# Patient Record
Sex: Female | Born: 1950 | Race: Asian | Hispanic: No | Marital: Single | Smoking: Never smoker
Health system: Southern US, Community
[De-identification: ages and names within clinical notes are randomized; demographics above are authoritative.]

## PROBLEM LIST (undated history)

## (undated) DIAGNOSIS — E119 Type 2 diabetes mellitus without complications: Secondary | ICD-10-CM

## (undated) DIAGNOSIS — E079 Disorder of thyroid, unspecified: Secondary | ICD-10-CM

## (undated) HISTORY — DX: Type 2 diabetes mellitus without complications: E11.9

## (undated) HISTORY — DX: Disorder of thyroid, unspecified: E07.9

---

## 2010-09-28 ENCOUNTER — Emergency Department: Payer: Self-pay | Admitting: Emergency Medicine

## 2012-12-27 ENCOUNTER — Emergency Department: Payer: Self-pay | Admitting: Emergency Medicine

## 2012-12-27 LAB — COMPREHENSIVE METABOLIC PANEL
Alkaline Phosphatase: 102 U/L (ref 50–136)
Anion Gap: 5 — ABNORMAL LOW (ref 7–16)
Bilirubin,Total: 0.9 mg/dL (ref 0.2–1.0)
Calcium, Total: 9.8 mg/dL (ref 8.5–10.1)
Co2: 28 mmol/L (ref 21–32)
EGFR (African American): >60
EGFR (Non-African Amer.): >60
Glucose: 140 mg/dL — ABNORMAL HIGH (ref 65–99)
SGOT(AST): 31 U/L (ref 15–37)
Sodium: 135 mmol/L — ABNORMAL LOW (ref 136–145)

## 2012-12-27 LAB — URINALYSIS, COMPLETE
Glucose,UR: NEGATIVE mg/dL (ref 0–75)
Protein: NEGATIVE
Squamous Epithelial: <1
WBC UR: 5 /HPF (ref 0–5)

## 2012-12-27 LAB — LIPASE, BLOOD: Lipase: 166 U/L (ref 73–393)

## 2012-12-27 LAB — CBC
HCT: 39.8 % (ref 35.0–47.0)
MCH: 29.5 pg (ref 26.0–34.0)
Platelet: 278 10*3/uL (ref 150–440)
RBC: 4.48 10*6/uL (ref 3.80–5.20)
RDW: 13.2 % (ref 11.5–14.5)

## 2013-10-14 DIAGNOSIS — I10 Essential (primary) hypertension: Secondary | ICD-10-CM | POA: Insufficient documentation

## 2014-05-13 ENCOUNTER — Institutional Professional Consult (permissible substitution): Payer: Self-pay | Admitting: Internal Medicine

## 2014-05-17 ENCOUNTER — Ambulatory Visit (INDEPENDENT_AMBULATORY_CARE_PROVIDER_SITE_OTHER): Payer: BC Managed Care – PPO | Admitting: Podiatry

## 2014-05-17 ENCOUNTER — Encounter: Payer: Self-pay | Admitting: Podiatry

## 2014-05-17 ENCOUNTER — Ambulatory Visit (INDEPENDENT_AMBULATORY_CARE_PROVIDER_SITE_OTHER): Payer: BC Managed Care – PPO

## 2014-05-17 VITALS — BP 110/65 | HR 79 | Resp 16 | Ht 61.0 in | Wt 135.0 lb

## 2014-05-17 DIAGNOSIS — M19079 Primary osteoarthritis, unspecified ankle and foot: Secondary | ICD-10-CM

## 2014-05-17 DIAGNOSIS — M7750 Other enthesopathy of unspecified foot: Secondary | ICD-10-CM

## 2014-05-17 DIAGNOSIS — M779 Enthesopathy, unspecified: Secondary | ICD-10-CM

## 2014-05-17 DIAGNOSIS — E119 Type 2 diabetes mellitus without complications: Secondary | ICD-10-CM

## 2014-05-17 DIAGNOSIS — M778 Other enthesopathies, not elsewhere classified: Secondary | ICD-10-CM

## 2014-05-17 NOTE — Progress Notes (Signed)
   Subjective:    Patient ID: Kathryn Poole, female    DOB: [birth year], 63 y.o.   MRN: [id number]  HPI Comments: Both of her feet hurt on the top. They have been hurting for 1 - 2 years. They are getting worse. It hurts to walk and stand. She was seen by dr Leeroy Bockchelsea holland and had x-rays and said she has arthritis. She does nothing for her feet.  Foot Pain      Review of Systems  Musculoskeletal:       Difficulty walking  All other systems reviewed and are negative.      Objective:   Physical Exam: I have reviewed her past medical history medications out of the surgery social history and review of systems. Pulses are strongly palpable bilateral. Neurologic sensorium is intact per Semmes-Weinstein monofilament. Deep tendon reflexes are intact bilaterally muscle strength is 5 over 5 dorsiflexion plantar flexors and inverters everters all intrinsic musculature is intact. Orthopedic evaluation demonstrates all joints distal to the ankle have a full range of motion without crepitation. 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 Radiographic evaluation confirms this. Cutaneous evaluation of his wrist supple well-hydrated daily is no erythema or edema cellulitis drainage or odor.        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  Plan: Injected dorsal aspect bilateral foot today with Kenalog and local anesthetic. Follow-up with me as needed.

## 2015-10-20 ENCOUNTER — Other Ambulatory Visit: Payer: Self-pay | Admitting: Nurse Practitioner

## 2015-10-20 DIAGNOSIS — Z1231 Encounter for screening mammogram for malignant neoplasm of breast: Secondary | ICD-10-CM

## 2015-11-15 ENCOUNTER — Ambulatory Visit
Admission: RE | Admit: 2015-11-15 | Discharge: 2015-11-15 | Disposition: A | Discharge status: Home / Self Care | Payer: BLUE CROSS/BLUE SHIELD | Source: Ambulatory Visit | Attending: Nurse Practitioner | Admitting: Nurse Practitioner

## 2015-11-15 DIAGNOSIS — Z1231 Encounter for screening mammogram for malignant neoplasm of breast: Secondary | ICD-10-CM | POA: Insufficient documentation

## 2016-04-19 ENCOUNTER — Encounter: Payer: Self-pay | Admitting: *Deleted

## 2016-04-19 ENCOUNTER — Encounter: Payer: BLUE CROSS/BLUE SHIELD | Attending: Internal Medicine | Admitting: *Deleted

## 2016-04-19 VITALS — BP 110/76 | Ht 59.0 in | Wt 137.7 lb

## 2016-04-19 DIAGNOSIS — Z6827 Body mass index (BMI) 27.0-27.9, adult: Secondary | ICD-10-CM | POA: Insufficient documentation

## 2016-04-19 DIAGNOSIS — Z713 Dietary counseling and surveillance: Secondary | ICD-10-CM | POA: Diagnosis not present

## 2016-04-19 DIAGNOSIS — E119 Type 2 diabetes mellitus without complications: Secondary | ICD-10-CM | POA: Diagnosis not present

## 2016-04-19 NOTE — Patient Instructions (Addendum)
Check blood sugars before breakfast every day and 2 hrs after supper 3-4 x week  Exercise: Continue walking  for   120  minutes   5  days a week  Eat 3 meals day, 1-2 snacks a day Space meals 4-6 hours apart Don't skip meals Complete 3 Day Food Record and bring to next appt  Bring blood sugar records to the next appointment  [facility] for [facility] on:  [facility] 2016 [facility]

## 2016-04-19 NOTE — Progress Notes (Signed)
Diabetes Self-Management Education  Visit Type: First/Initial  Appt. Start Time: 1320 Appt. End Time: 1435  04/19/2016  [name], identified by name and date of birth, is a 65 y.o. female with a diagnosis of Diabetes: Type 2.   ASSESSMENT  Blood pressure 110/76, height 4\' 11"  (1.499 m), weight 137 lb 11.2 oz (62.5 kg). Body mass index is 27.81 kg/m.      Diabetes Self-Management Education - 04/19/16 1558      Visit Information   Visit Type First/Initial     Initial Visit   Diabetes Type Type 2   Are you currently following a meal plan? No   Are you taking your medications as prescribed? Yes  Pt and son didn't know what medications she was taking. Provided AVS and instructed them to verify meds at home and keep a copy for every MD appt.    Date Diagnosed 2 years     Health Coping   How would you rate your overall health? Good     Psychosocial Assessment   Patient Belief/Attitude about Diabetes Other (comment)  "normal"   Self-care barriers Low literacy   Self-management support Doctor's office;Family   Other persons present Interpreter;Family Member  son   Patient Concerns Nutrition/Meal planning;Glycemic Control;Monitoring;Healthy Lifestyle   Special Needs Simplified materials;Instruct caregiver;Other (comment)  provided written information in [facility]   Preferred Learning Style Visual   Learning Readiness Ready   How often do you need to have someone help you when you read instructions, pamphlets, or other written materials from your doctor or pharmacy? 5 - Always   What is the last grade level you completed in school? 3rd grade level     Pre-Education Assessment   Patient understands the diabetes disease and treatment process. Needs Instruction   Patient understands incorporating nutritional management into lifestyle. Needs Instruction   Patient undertands incorporating physical activity into lifestyle. Needs Instruction   Patient understands using  medications safely. Needs Instruction   Patient understands monitoring blood glucose, interpreting and using results Needs Review   Patient understands prevention, detection, and treatment of acute complications. Needs Instruction   Patient understands prevention, detection, and treatment of chronic complications. Needs Instruction   Patient understands how to develop strategies to address psychosocial issues. Needs Instruction   Patient understands how to develop strategies to promote health/change behavior. Needs Instruction     Complications   Last HgB A1C per patient/outside source 7.7 %  04/17/2016   How often do you check your blood sugar? 1-2 times/day   Fasting Blood glucose range (mg/dL) 95-621;308-65770-129;130-179  Pt reports FBG's 120-140 mg/dL   Have you had a dilated eye exam in the past 12 months? No   Have you had a dental exam in the past 12 months? No   Are you checking your feet? Yes   How many days per week are you checking your feet? 7     Dietary Intake   Breakfast skips or has an egg   Lunch rice, soup, vegetables - lettuce, cuccumbers, tomatoes   Snack (afternoon) fruit   Dinner fish or chicken, sometimes beef or pork, rice, salad   Beverage(s) water     Exercise   Exercise Type Light (walking / raking leaves)   How many days per week to you exercise? 5   How many minutes per day do you exercise? 120   Total minutes per week of exercise 600     Patient Education   Previous Diabetes Education No  Disease state  Definition of diabetes, type 1 and 2, and the diagnosis of diabetes   Nutrition management  Role of diet in the treatment of diabetes and the relationship between the three main macronutrients and blood glucose level;Food label reading, portion sizes and measuring food.   Physical activity and exercise  Role of exercise on diabetes management, blood pressure control and cardiac health.   Medications Reviewed patients medication for diabetes, action, purpose,  timing of dose and side effects.;Other (comment)  Keep current list of medications on hand.   Monitoring Purpose and frequency of SMBG.;Identified appropriate SMBG and/or A1C goals.   Chronic complications Relationship between chronic complications and blood glucose control   Psychosocial adjustment Identified and addressed patients feelings and concerns about diabetes     Individualized Goals (developed by patient)   Reducing Risk Improve blood sugars Prevent diabetes complications Lead a healthier lifestyle     Outcomes   Expected Outcomes Demonstrated interest in learning. Expect positive outcomes   Future DMSE 4-6 wks      Individualized Plan for Diabetes Self-Management Training:   Learning Objective:  Patient will have a greater understanding of diabetes self-management. Patient education plan is to attend individual and/or group sessions per assessed needs and concerns.   Plan:   Patient Instructions  Check blood sugars before breakfast every day and 2 hrs after supper 3-4 x week Exercise: Continue walking  for   120  minutes   5  days a week Eat 3 meals day, 1-2 snacks a day Space meals 4-6 hours apart Don't skip meals Complete 3 Day Food Record and bring to next appt Bring blood sugar records to the next appointment Return for appointment on:  Thursday May 17, 2016 [facility] 9:00 [facility]   Expected Outcomes:  Demonstrated interest in learning. Expect positive outcomes  Education material provided:  General Meal Planning Guidelines [place]namese Food Guide 3 Day Food Record 4 Steps to Control Your Diabetes for Life (Vietnamese)  If problems or questions, patient to contact team via:  Sharion SettlerSheila Roswell Ndiaye, RN, CCM, CDE [phone]  Future DSME appointment: 4-6 wks  May 17, 2016 for appointment with dietitian

## 2016-05-17 ENCOUNTER — Ambulatory Visit: Payer: BLUE CROSS/BLUE SHIELD | Admitting: Dietician

## 2016-06-01 ENCOUNTER — Encounter: Payer: Self-pay | Admitting: Dietician

## 2016-06-01 NOTE — Progress Notes (Signed)
Have not heard from patient since she cancelled her appointment for 05/17/16 due to out-of state trip of unkown duration. Sent discharge letter to MD.

## 2016-08-09 ENCOUNTER — Other Ambulatory Visit: Payer: Self-pay | Admitting: Nurse Practitioner

## 2016-08-09 DIAGNOSIS — Z1231 Encounter for screening mammogram for malignant neoplasm of breast: Secondary | ICD-10-CM

## 2016-11-15 ENCOUNTER — Ambulatory Visit
Admission: RE | Admit: 2016-11-15 | Discharge: 2016-11-15 | Disposition: A | Discharge status: Home / Self Care | Payer: BLUE CROSS/BLUE SHIELD | Source: Ambulatory Visit | Attending: Nurse Practitioner | Admitting: Nurse Practitioner

## 2016-11-15 DIAGNOSIS — Z1231 Encounter for screening mammogram for malignant neoplasm of breast: Secondary | ICD-10-CM

## 2017-09-24 ENCOUNTER — Other Ambulatory Visit: Payer: Self-pay | Admitting: Nurse Practitioner

## 2017-09-24 DIAGNOSIS — Z1231 Encounter for screening mammogram for malignant neoplasm of breast: Secondary | ICD-10-CM

## 2017-11-18 ENCOUNTER — Ambulatory Visit
Admission: RE | Admit: 2017-11-18 | Discharge: 2017-11-18 | Disposition: A | Discharge status: Home / Self Care | Payer: Medicare HMO | Source: Ambulatory Visit | Attending: Nurse Practitioner | Admitting: Nurse Practitioner

## 2017-11-18 DIAGNOSIS — Z1231 Encounter for screening mammogram for malignant neoplasm of breast: Secondary | ICD-10-CM | POA: Insufficient documentation

## 2018-09-09 ENCOUNTER — Other Ambulatory Visit: Payer: Self-pay | Admitting: Nurse Practitioner

## 2018-09-09 DIAGNOSIS — Z1231 Encounter for screening mammogram for malignant neoplasm of breast: Secondary | ICD-10-CM

## 2019-01-15 ENCOUNTER — Other Ambulatory Visit: Payer: Self-pay

## 2019-01-15 ENCOUNTER — Ambulatory Visit
Admission: RE | Admit: 2019-01-15 | Discharge: 2019-01-15 | Disposition: A | Discharge status: Home / Self Care | Payer: Medicare HMO | Source: Ambulatory Visit | Attending: Nurse Practitioner | Admitting: Nurse Practitioner

## 2019-01-15 DIAGNOSIS — Z1231 Encounter for screening mammogram for malignant neoplasm of breast: Secondary | ICD-10-CM | POA: Diagnosis present

## 2019-06-03 ENCOUNTER — Ambulatory Visit: Payer: Medicare HMO | Attending: Internal Medicine

## 2019-06-03 DIAGNOSIS — Z20822 Contact with and (suspected) exposure to covid-19: Secondary | ICD-10-CM

## 2019-06-04 LAB — NOVEL CORONAVIRUS, NAA: SARS-CoV-2, NAA: DETECTED — AB

## 2019-07-18 ENCOUNTER — Ambulatory Visit: Payer: Medicare HMO | Attending: Internal Medicine

## 2019-07-18 DIAGNOSIS — Z23 Encounter for immunization: Secondary | ICD-10-CM | POA: Insufficient documentation

## 2019-07-18 NOTE — Progress Notes (Signed)
   Covid-19 Vaccination Clinic  Name:  Kathryn Poole    MRN: [id number] DOB: July 21, 1950  07/18/2019  [name] was observed post Covid-19 immunization for 15 minutes without incidence. She was provided with Vaccine Information Sheet and instruction to access the V-Safe system.   [name] was instructed to call 911 with any severe reactions post vaccine: Marland Kitchen Difficulty breathing  . Swelling of your face and throat  . A fast heartbeat  . A bad rash all over your body  . Dizziness and weakness    Immunizations Administered    Name Date Dose VIS Date Route   Pfizer COVID-19 Vaccine 07/18/2019  8:40 AM 0.3 mL 05/15/2019 Intramuscular   Manufacturer: [employer], [employer]   Lot: [id number]   NDC: [address]-7

## 2019-08-09 ENCOUNTER — Ambulatory Visit: Payer: Medicare HMO | Attending: Internal Medicine

## 2019-08-09 ENCOUNTER — Other Ambulatory Visit: Payer: Self-pay

## 2019-08-09 DIAGNOSIS — Z23 Encounter for immunization: Secondary | ICD-10-CM | POA: Insufficient documentation

## 2019-08-09 NOTE — Progress Notes (Signed)
   Covid-19 Vaccination Clinic  Name:  Kathryn Poole    MRN: [id number] DOB: 20-May-1951  08/09/2019  Kathryn Poole was observed post Covid-19 immunization for 15 minutes without incident. She was provided with Vaccine Information Sheet and instruction to access the V-Safe system.   Kathryn Poole was instructed to call 911 with any severe reactions post vaccine: Marland Kitchen Difficulty breathing  . Swelling of face and throat  . A fast heartbeat  . A bad rash all over body  . Dizziness and weakness   Immunizations Administered    Name Date Dose VIS Date Route   Pfizer COVID-19 Vaccine 08/09/2019  9:45 AM 0.3 mL 05/15/2019 Intramuscular   Manufacturer: [employer], [employer]   Lot: [id number]   NDC: [address]-0

## 2020-04-07 ENCOUNTER — Other Ambulatory Visit: Payer: Self-pay | Admitting: Nurse Practitioner

## 2020-04-07 DIAGNOSIS — Z1231 Encounter for screening mammogram for malignant neoplasm of breast: Secondary | ICD-10-CM

## 2020-05-18 ENCOUNTER — Ambulatory Visit
Admission: RE | Admit: 2020-05-18 | Discharge: 2020-05-18 | Disposition: A | Discharge status: Home / Self Care | Payer: Medicare HMO | Source: Ambulatory Visit | Attending: Nurse Practitioner | Admitting: Nurse Practitioner

## 2020-05-18 ENCOUNTER — Other Ambulatory Visit: Payer: Self-pay

## 2020-05-18 DIAGNOSIS — Z1231 Encounter for screening mammogram for malignant neoplasm of breast: Secondary | ICD-10-CM | POA: Diagnosis not present

## 2020-11-07 DIAGNOSIS — E039 Hypothyroidism, unspecified: Secondary | ICD-10-CM | POA: Diagnosis not present

## 2021-01-02 DIAGNOSIS — E119 Type 2 diabetes mellitus without complications: Secondary | ICD-10-CM | POA: Diagnosis not present

## 2021-01-02 DIAGNOSIS — E782 Mixed hyperlipidemia: Secondary | ICD-10-CM | POA: Diagnosis not present

## 2021-01-02 DIAGNOSIS — E039 Hypothyroidism, unspecified: Secondary | ICD-10-CM | POA: Diagnosis not present

## 2021-01-02 DIAGNOSIS — I10 Essential (primary) hypertension: Secondary | ICD-10-CM | POA: Diagnosis not present

## 2021-01-03 DIAGNOSIS — I10 Essential (primary) hypertension: Secondary | ICD-10-CM | POA: Diagnosis not present

## 2021-01-03 DIAGNOSIS — E781 Pure hyperglyceridemia: Secondary | ICD-10-CM | POA: Diagnosis not present

## 2021-01-03 DIAGNOSIS — E119 Type 2 diabetes mellitus without complications: Secondary | ICD-10-CM | POA: Diagnosis not present

## 2021-01-03 DIAGNOSIS — E785 Hyperlipidemia, unspecified: Secondary | ICD-10-CM | POA: Diagnosis not present

## 2021-01-03 DIAGNOSIS — E039 Hypothyroidism, unspecified: Secondary | ICD-10-CM | POA: Diagnosis not present

## 2021-01-09 DIAGNOSIS — G629 Polyneuropathy, unspecified: Secondary | ICD-10-CM | POA: Diagnosis not present

## 2021-01-09 DIAGNOSIS — E785 Hyperlipidemia, unspecified: Secondary | ICD-10-CM | POA: Diagnosis not present

## 2021-01-09 DIAGNOSIS — I10 Essential (primary) hypertension: Secondary | ICD-10-CM | POA: Diagnosis not present

## 2021-01-09 DIAGNOSIS — F5101 Primary insomnia: Secondary | ICD-10-CM | POA: Diagnosis not present

## 2021-01-09 DIAGNOSIS — E1165 Type 2 diabetes mellitus with hyperglycemia: Secondary | ICD-10-CM | POA: Diagnosis not present

## 2021-02-16 DIAGNOSIS — E039 Hypothyroidism, unspecified: Secondary | ICD-10-CM | POA: Diagnosis not present

## 2021-02-21 DIAGNOSIS — K5730 Diverticulosis of large intestine without perforation or abscess without bleeding: Secondary | ICD-10-CM | POA: Diagnosis not present

## 2021-02-21 DIAGNOSIS — K5900 Constipation, unspecified: Secondary | ICD-10-CM | POA: Diagnosis not present

## 2021-02-21 DIAGNOSIS — K921 Melena: Secondary | ICD-10-CM | POA: Diagnosis not present

## 2021-02-21 DIAGNOSIS — E11618 Type 2 diabetes mellitus with other diabetic arthropathy: Secondary | ICD-10-CM | POA: Diagnosis not present

## 2021-02-24 DIAGNOSIS — K921 Melena: Secondary | ICD-10-CM | POA: Diagnosis not present

## 2021-03-02 DIAGNOSIS — I10 Essential (primary) hypertension: Secondary | ICD-10-CM | POA: Diagnosis not present

## 2021-03-02 DIAGNOSIS — K921 Melena: Secondary | ICD-10-CM | POA: Diagnosis not present

## 2021-03-02 DIAGNOSIS — E1165 Type 2 diabetes mellitus with hyperglycemia: Secondary | ICD-10-CM | POA: Diagnosis not present

## 2021-03-02 DIAGNOSIS — K5900 Constipation, unspecified: Secondary | ICD-10-CM | POA: Diagnosis not present

## 2021-05-01 DIAGNOSIS — E785 Hyperlipidemia, unspecified: Secondary | ICD-10-CM | POA: Diagnosis not present

## 2021-05-01 DIAGNOSIS — I10 Essential (primary) hypertension: Secondary | ICD-10-CM | POA: Diagnosis not present

## 2021-05-08 DIAGNOSIS — E785 Hyperlipidemia, unspecified: Secondary | ICD-10-CM | POA: Diagnosis not present

## 2021-05-08 DIAGNOSIS — I10 Essential (primary) hypertension: Secondary | ICD-10-CM | POA: Diagnosis not present

## 2021-05-08 DIAGNOSIS — E1165 Type 2 diabetes mellitus with hyperglycemia: Secondary | ICD-10-CM | POA: Diagnosis not present

## 2021-05-08 DIAGNOSIS — E1342 Other specified diabetes mellitus with diabetic polyneuropathy: Secondary | ICD-10-CM | POA: Diagnosis not present

## 2021-08-21 DIAGNOSIS — E039 Hypothyroidism, unspecified: Secondary | ICD-10-CM | POA: Diagnosis not present

## 2021-08-21 DIAGNOSIS — E119 Type 2 diabetes mellitus without complications: Secondary | ICD-10-CM | POA: Diagnosis not present

## 2021-09-04 DIAGNOSIS — I10 Essential (primary) hypertension: Secondary | ICD-10-CM | POA: Diagnosis not present

## 2021-09-04 DIAGNOSIS — E1165 Type 2 diabetes mellitus with hyperglycemia: Secondary | ICD-10-CM | POA: Diagnosis not present

## 2021-09-04 DIAGNOSIS — E785 Hyperlipidemia, unspecified: Secondary | ICD-10-CM | POA: Diagnosis not present

## 2021-09-11 DIAGNOSIS — E039 Hypothyroidism, unspecified: Secondary | ICD-10-CM | POA: Diagnosis not present

## 2021-09-11 DIAGNOSIS — E1165 Type 2 diabetes mellitus with hyperglycemia: Secondary | ICD-10-CM | POA: Diagnosis not present

## 2021-09-11 DIAGNOSIS — E785 Hyperlipidemia, unspecified: Secondary | ICD-10-CM | POA: Diagnosis not present

## 2021-09-11 DIAGNOSIS — I10 Essential (primary) hypertension: Secondary | ICD-10-CM | POA: Diagnosis not present

## 2021-12-12 DIAGNOSIS — E119 Type 2 diabetes mellitus without complications: Secondary | ICD-10-CM | POA: Diagnosis not present

## 2021-12-12 DIAGNOSIS — E039 Hypothyroidism, unspecified: Secondary | ICD-10-CM | POA: Diagnosis not present

## 2022-01-08 DIAGNOSIS — E1165 Type 2 diabetes mellitus with hyperglycemia: Secondary | ICD-10-CM | POA: Diagnosis not present

## 2022-01-08 DIAGNOSIS — I10 Essential (primary) hypertension: Secondary | ICD-10-CM | POA: Diagnosis not present

## 2022-01-08 DIAGNOSIS — E785 Hyperlipidemia, unspecified: Secondary | ICD-10-CM | POA: Diagnosis not present

## 2022-01-15 DIAGNOSIS — E785 Hyperlipidemia, unspecified: Secondary | ICD-10-CM | POA: Diagnosis not present

## 2022-01-15 DIAGNOSIS — E6609 Other obesity due to excess calories: Secondary | ICD-10-CM | POA: Diagnosis not present

## 2022-01-15 DIAGNOSIS — E039 Hypothyroidism, unspecified: Secondary | ICD-10-CM | POA: Diagnosis not present

## 2022-01-15 DIAGNOSIS — I10 Essential (primary) hypertension: Secondary | ICD-10-CM | POA: Diagnosis not present

## 2022-01-15 DIAGNOSIS — E1165 Type 2 diabetes mellitus with hyperglycemia: Secondary | ICD-10-CM | POA: Diagnosis not present

## 2022-03-22 DIAGNOSIS — E039 Hypothyroidism, unspecified: Secondary | ICD-10-CM | POA: Diagnosis not present

## 2022-03-22 DIAGNOSIS — E119 Type 2 diabetes mellitus without complications: Secondary | ICD-10-CM | POA: Diagnosis not present

## 2022-03-27 DIAGNOSIS — E039 Hypothyroidism, unspecified: Secondary | ICD-10-CM | POA: Diagnosis not present

## 2022-03-27 DIAGNOSIS — E1165 Type 2 diabetes mellitus with hyperglycemia: Secondary | ICD-10-CM | POA: Diagnosis not present

## 2022-03-27 DIAGNOSIS — H40003 Preglaucoma, unspecified, bilateral: Secondary | ICD-10-CM | POA: Diagnosis not present

## 2022-05-16 DIAGNOSIS — I10 Essential (primary) hypertension: Secondary | ICD-10-CM | POA: Diagnosis not present

## 2022-05-16 DIAGNOSIS — E785 Hyperlipidemia, unspecified: Secondary | ICD-10-CM | POA: Diagnosis not present

## 2022-05-16 DIAGNOSIS — E039 Hypothyroidism, unspecified: Secondary | ICD-10-CM | POA: Diagnosis not present

## 2022-05-22 DIAGNOSIS — I10 Essential (primary) hypertension: Secondary | ICD-10-CM | POA: Diagnosis not present

## 2022-05-22 DIAGNOSIS — E875 Hyperkalemia: Secondary | ICD-10-CM | POA: Diagnosis not present

## 2022-05-22 DIAGNOSIS — E039 Hypothyroidism, unspecified: Secondary | ICD-10-CM | POA: Diagnosis not present

## 2022-05-22 DIAGNOSIS — E785 Hyperlipidemia, unspecified: Secondary | ICD-10-CM | POA: Diagnosis not present

## 2022-05-22 DIAGNOSIS — E1165 Type 2 diabetes mellitus with hyperglycemia: Secondary | ICD-10-CM | POA: Diagnosis not present

## 2022-05-22 DIAGNOSIS — Z0001 Encounter for general adult medical examination with abnormal findings: Secondary | ICD-10-CM | POA: Diagnosis not present

## 2022-06-13 DIAGNOSIS — H5203 Hypermetropia, bilateral: Secondary | ICD-10-CM | POA: Diagnosis not present

## 2022-06-13 DIAGNOSIS — E119 Type 2 diabetes mellitus without complications: Secondary | ICD-10-CM | POA: Diagnosis not present

## 2022-06-13 DIAGNOSIS — H25013 Cortical age-related cataract, bilateral: Secondary | ICD-10-CM | POA: Diagnosis not present

## 2022-06-13 DIAGNOSIS — H2513 Age-related nuclear cataract, bilateral: Secondary | ICD-10-CM | POA: Diagnosis not present

## 2022-06-13 DIAGNOSIS — H52223 Regular astigmatism, bilateral: Secondary | ICD-10-CM | POA: Diagnosis not present

## 2022-06-13 DIAGNOSIS — Z7984 Long term (current) use of oral hypoglycemic drugs: Secondary | ICD-10-CM | POA: Diagnosis not present

## 2022-06-13 DIAGNOSIS — H40053 Ocular hypertension, bilateral: Secondary | ICD-10-CM | POA: Diagnosis not present

## 2022-06-13 DIAGNOSIS — H524 Presbyopia: Secondary | ICD-10-CM | POA: Diagnosis not present

## 2022-07-18 ENCOUNTER — Ambulatory Visit: Payer: Medicare HMO | Admitting: Nurse Practitioner

## 2022-08-04 ENCOUNTER — Other Ambulatory Visit: Payer: Self-pay | Admitting: Nurse Practitioner

## 2022-09-18 ENCOUNTER — Other Ambulatory Visit: Payer: Medicare HMO

## 2022-09-18 DIAGNOSIS — E785 Hyperlipidemia, unspecified: Secondary | ICD-10-CM | POA: Diagnosis not present

## 2022-09-18 DIAGNOSIS — E669 Obesity, unspecified: Secondary | ICD-10-CM

## 2022-09-18 DIAGNOSIS — I10 Essential (primary) hypertension: Secondary | ICD-10-CM | POA: Diagnosis not present

## 2022-09-18 DIAGNOSIS — E66811 Obesity, class 1: Secondary | ICD-10-CM

## 2022-09-19 LAB — CMP14+EGFR
ALT: 16 IU/L (ref 0–32)
AST: 25 IU/L (ref 0–40)
Albumin/Globulin Ratio: 1.7 (ref 1.2–2.2)
Albumin: 4.6 g/dL (ref 3.9–4.9)
Alkaline Phosphatase: 76 IU/L (ref 44–121)
BUN/Creatinine Ratio: 29 — ABNORMAL HIGH (ref 12–28)
BUN: 25 mg/dL (ref 8–27)
Bilirubin Total: 0.7 mg/dL (ref 0.0–1.2)
CO2: 24 mmol/L (ref 20–29)
Calcium: 9.7 mg/dL (ref 8.7–10.3)
Chloride: 97 mmol/L (ref 96–106)
Creatinine, Ser: 0.87 mg/dL (ref 0.57–1.00)
Globulin, Total: 2.7 g/dL (ref 1.5–4.5)
Glucose: 134 mg/dL — ABNORMAL HIGH (ref 70–99)
Potassium: 4.8 mmol/L (ref 3.5–5.2)
Sodium: 138 mmol/L (ref 134–144)
Total Protein: 7.3 g/dL (ref 6.0–8.5)
eGFR: 72 mL/min/{1.73_m2} (ref >59)

## 2022-09-19 LAB — HEMOGLOBIN A1C
Est. average glucose Bld gHb Est-mCnc: 186 mg/dL
Hgb A1c MFr Bld: 8.1 % — ABNORMAL HIGH (ref 4.8–5.6)

## 2022-09-19 LAB — CBC WITH DIFFERENTIAL
Basophils Absolute: 0.1 10*3/uL (ref 0.0–0.2)
Basos: 1 %
EOS (ABSOLUTE): 0.1 10*3/uL (ref 0.0–0.4)
Eos: 1 %
Hematocrit: 48.7 % — ABNORMAL HIGH (ref 34.0–46.6)
Hemoglobin: 15.7 g/dL (ref 11.1–15.9)
Immature Grans (Abs): 0 10*3/uL (ref 0.0–0.1)
Immature Granulocytes: 0 %
Lymphocytes Absolute: 2.5 10*3/uL (ref 0.7–3.1)
Lymphs: 27 %
MCH: 30.1 pg (ref 26.6–33.0)
MCHC: 32.2 g/dL (ref 31.5–35.7)
MCV: 94 fL (ref 79–97)
Monocytes Absolute: 0.6 10*3/uL (ref 0.1–0.9)
Monocytes: 7 %
Neutrophils Absolute: 5.8 10*3/uL (ref 1.4–7.0)
Neutrophils: 64 %
RBC: 5.21 x10E6/uL (ref 3.77–5.28)
RDW: 13 % (ref 11.7–15.4)
WBC: 9.1 10*3/uL (ref 3.4–10.8)

## 2022-09-19 LAB — LIPID PANEL
Chol/HDL Ratio: 3.1 ratio (ref 0.0–4.4)
Cholesterol, Total: 209 mg/dL — ABNORMAL HIGH (ref 100–199)
HDL: 68 mg/dL (ref >39)
LDL Chol Calc (NIH): 111 mg/dL — ABNORMAL HIGH (ref 0–99)
Triglycerides: 174 mg/dL — ABNORMAL HIGH (ref 0–149)
VLDL Cholesterol Cal: 30 mg/dL (ref 5–40)

## 2022-09-19 LAB — TSH: TSH: 17.1 u[IU]/mL — ABNORMAL HIGH (ref 0.450–4.500)

## 2022-09-19 IMAGING — MG DIGITAL SCREENING BILAT W/ TOMO W/ CAD
6 of 10 series · 6 of 30 positions shown · non-contrast
Comparison: Previous exam(s).

CLINICAL DATA: Screening.

EXAM:
DIGITAL SCREENING BILATERAL MAMMOGRAM WITH TOMO AND CAD

[L CC synth-2D]
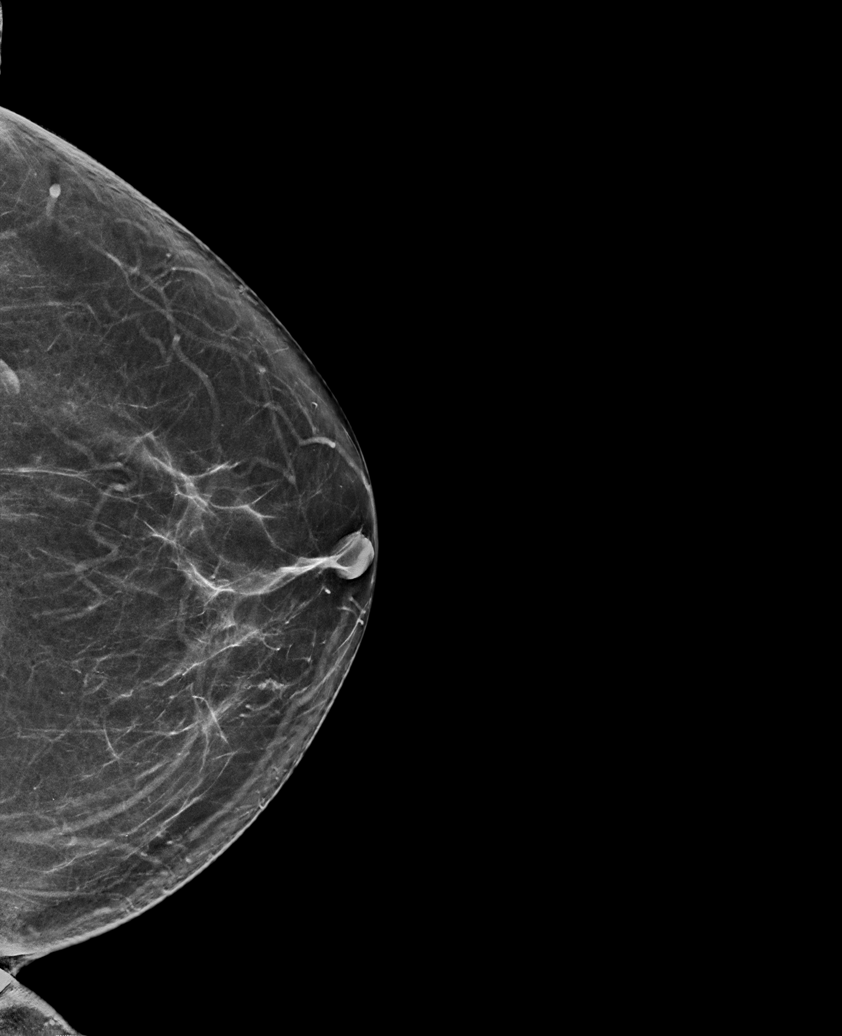

[L MLO synth-2D (1 of 2)]
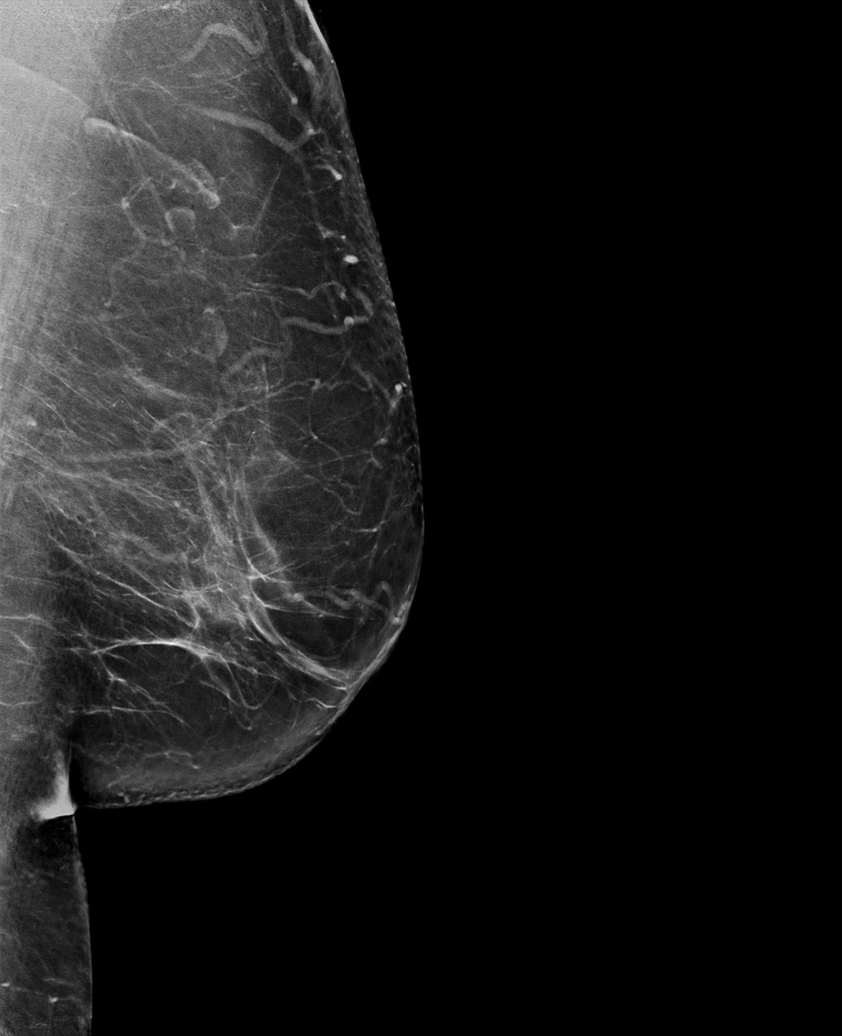

[L MLO synth-2D (2 of 2)]
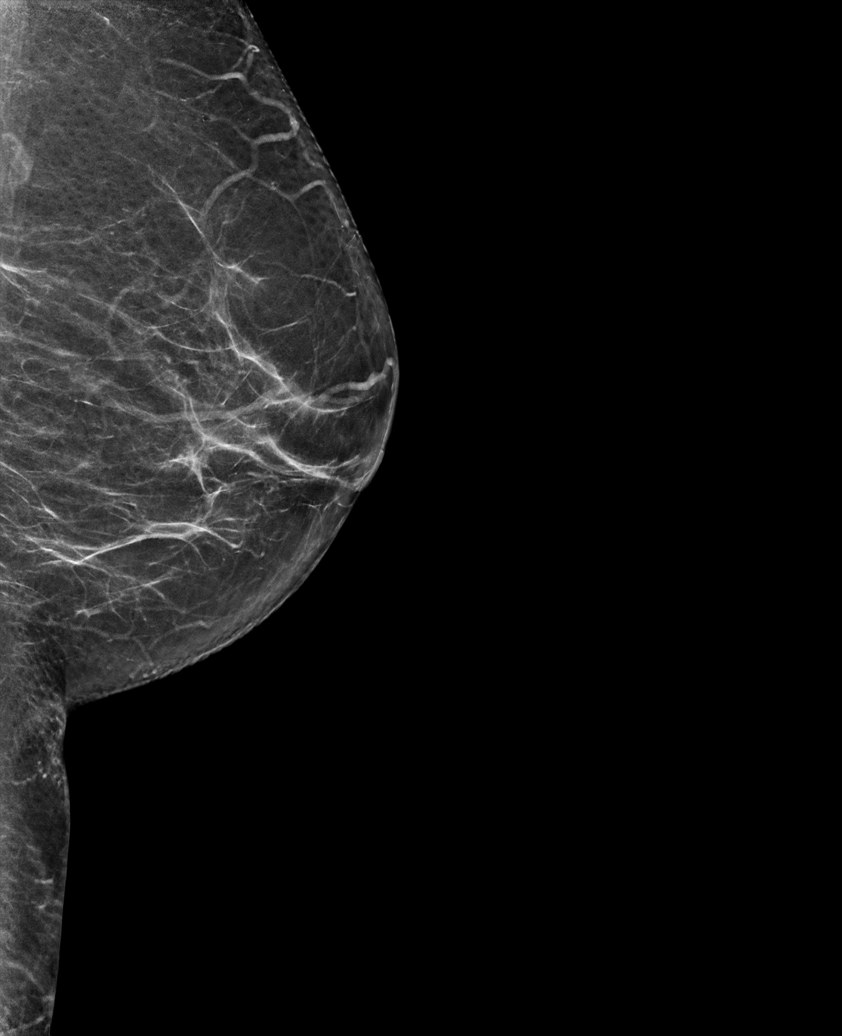

[R MLO synth-2D]
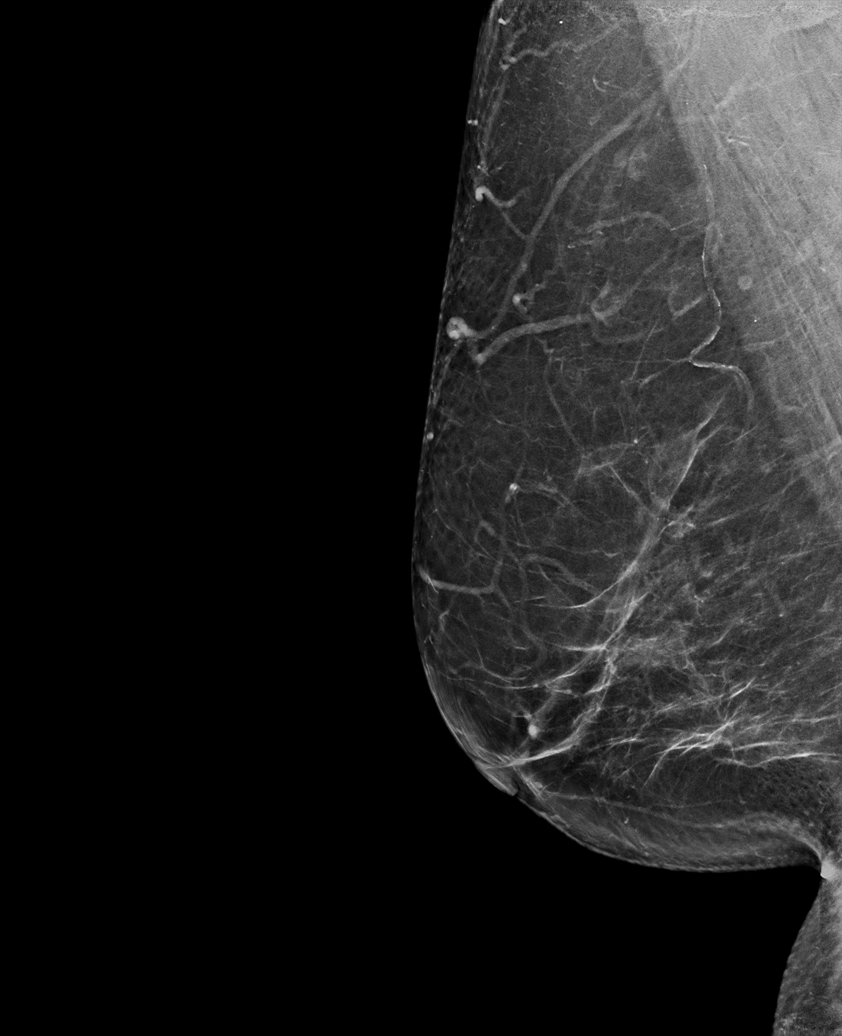

[R CC synth-2D]
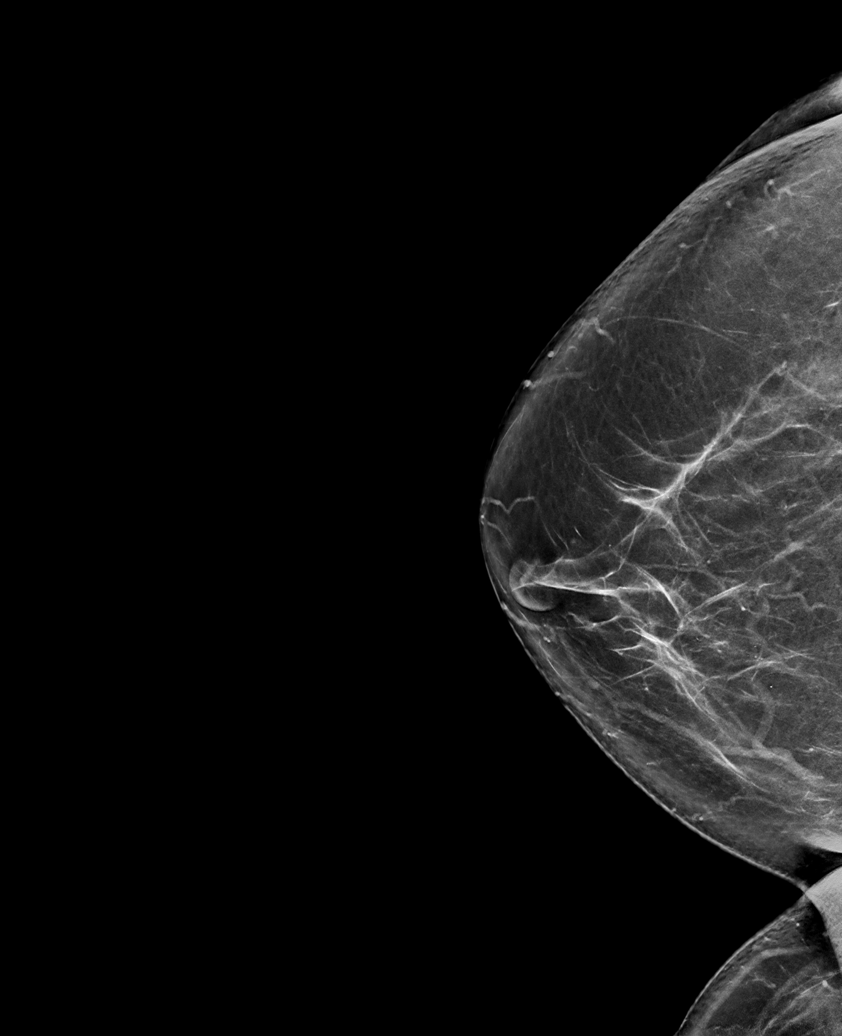

[R MLO tomo · tomo slice 40/79.0]
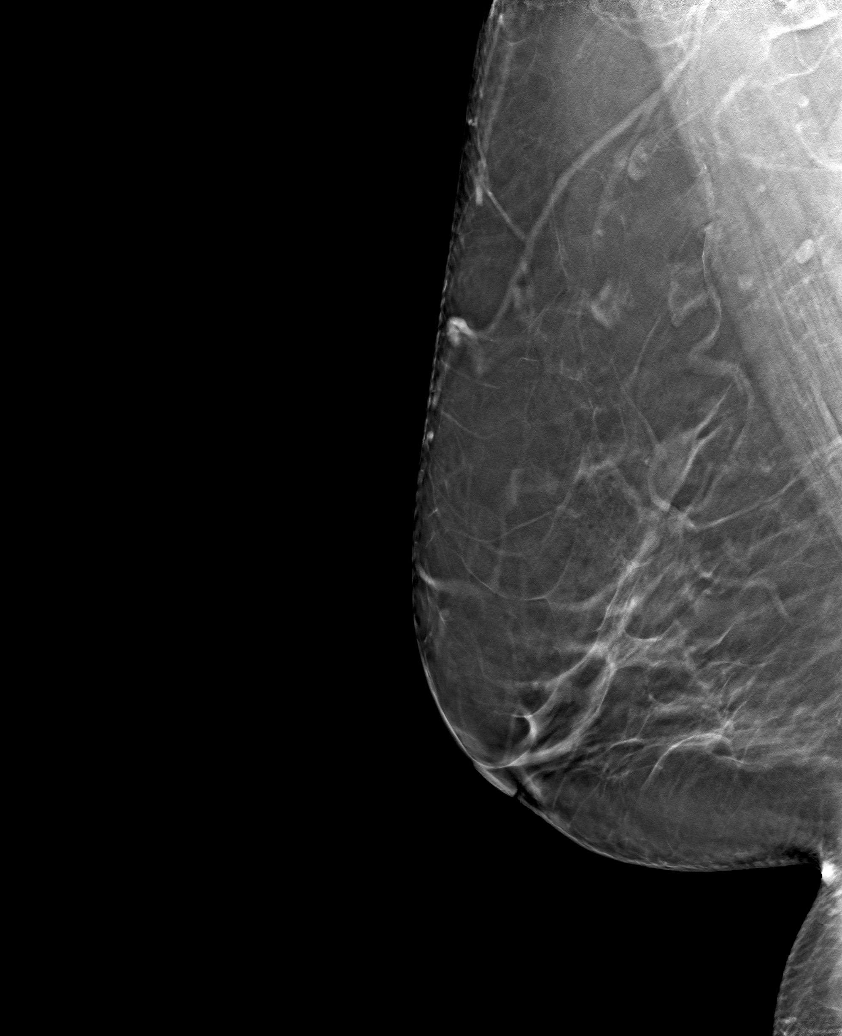

[6 of 30 positions shown; findings below may reference images not displayed]

ACR Breast Density Category b: There are scattered areas of
fibroglandular density.
FINDINGS: There are no findings suspicious for malignancy. Images were
processed with CAD.
IMPRESSION: No mammographic evidence of malignancy. A result letter of this
screening mammogram will be mailed directly to the patient.

RECOMMENDATION:
Screening mammogram in one year. (Code:[id number])

BI-RADS CATEGORY  1: Negative.

## 2022-09-25 ENCOUNTER — Ambulatory Visit (INDEPENDENT_AMBULATORY_CARE_PROVIDER_SITE_OTHER): Payer: Medicare HMO | Admitting: Nurse Practitioner

## 2022-09-25 ENCOUNTER — Encounter: Payer: Self-pay | Admitting: Nurse Practitioner

## 2022-09-25 VITALS — BP 122/70 | HR 65 | Ht 62.0 in | Wt 165.4 lb

## 2022-09-25 DIAGNOSIS — E782 Mixed hyperlipidemia: Secondary | ICD-10-CM | POA: Insufficient documentation

## 2022-09-25 DIAGNOSIS — J45909 Unspecified asthma, uncomplicated: Secondary | ICD-10-CM | POA: Diagnosis not present

## 2022-09-25 DIAGNOSIS — E1165 Type 2 diabetes mellitus with hyperglycemia: Secondary | ICD-10-CM | POA: Diagnosis not present

## 2022-09-25 DIAGNOSIS — E039 Hypothyroidism, unspecified: Secondary | ICD-10-CM | POA: Insufficient documentation

## 2022-09-25 NOTE — Patient Instructions (Addendum)
1) Lab in 1 month to repeat thyroid 2) Follow up appt in 4 months, fasting labs prior

## 2022-09-25 NOTE — Progress Notes (Signed)
Established Patient Office Visit  Subjective:  Patient ID: Kathryn Poole, female    DOB: August 15, 1950  Age: 72 y.o. MRN: [id number]  No chief complaint on file.   Follow up and review of recent fasting labs.  Thyroid elevated, upon questioning, patient has been taking with food and other meds in the AM.  Also reinforced cholesterol medication nightly and low cholesterol diet.    No other concerns at this time.   Past Medical History:  Diagnosis Date   Diabetes (HCC)    Thyroid disease     No past surgical history on file.  Social History   Socioeconomic History   Marital status: Single    Spouse name: Not on file   Number of children: Not on file   Years of education: Not on file   Highest education level: Not on file  Occupational History   Not on file  Tobacco Use   Smoking status: Never   Smokeless tobacco: Never  Substance and Sexual Activity   Alcohol use: No    Alcohol/week: 0.0 standard drinks of alcohol   Drug use: Not on file   Sexual activity: Not on file  Other Topics Concern   Not on file  Social History Narrative   Not on file   Social Determinants of Health   Financial Resource Strain: Not on file  Food Insecurity: Not on file  Transportation Needs: Not on file  Physical Activity: Not on file  Stress: Not on file  Social Connections: Not on file  Intimate Partner Violence: Not on file    Family History  Problem Relation Age of Onset   Breast cancer Neg Hx     No Known Allergies  Review of Systems  Constitutional: Negative.   HENT: Negative.    Eyes: Negative.   Respiratory: Negative.    Cardiovascular: Negative.   Gastrointestinal: Negative.   Genitourinary: Negative.   Musculoskeletal: Negative.   Skin: Negative.   Neurological: Negative.   Endo/Heme/Allergies: Negative.   Psychiatric/Behavioral: Negative.         Objective:   There were no vitals taken for this visit.  There were no vitals filed for this visit.  Physical  Exam Vitals reviewed.  Constitutional:      Appearance: Normal appearance.  HENT:     Head: Normocephalic.     Nose: Nose normal.     Mouth/Throat:     Mouth: Mucous membranes are moist.  Eyes:     Pupils: Pupils are equal, round, and reactive to light.  Cardiovascular:     Rate and Rhythm: Normal rate and regular rhythm.  Pulmonary:     Effort: Pulmonary effort is normal.     Breath sounds: Normal breath sounds.  Abdominal:     General: Bowel sounds are normal.     Palpations: Abdomen is soft.  Musculoskeletal:        General: Normal range of motion.     Cervical back: Normal range of motion and neck supple.  Skin:    General: Skin is warm and dry.  Neurological:     Mental Status: She is alert and oriented to person, place, and time.  Psychiatric:        Mood and Affect: Mood normal.        Behavior: Behavior normal.      No results found for any visits on 09/25/22.  Recent Results (from the past 2160 hour(s))  Hemoglobin A1c     Status: Abnormal   Collection  Time: 09/18/22  9:33 AM  Result Value Ref Range   Hgb A1c MFr Bld 8.1 (H) 4.8 - 5.6 %    Comment:          Prediabetes: 5.7 - 6.4          Diabetes: >6.4          Glycemic control for adults with diabetes: <7.0    Est. average glucose Bld gHb Est-mCnc 186 mg/dL  TSH     Status: Abnormal   Collection Time: 09/18/22  9:33 AM  Result Value Ref Range   TSH 17.100 (H) 0.450 - 4.500 uIU/mL  CMP14+EGFR     Status: Abnormal   Collection Time: 09/18/22  9:33 AM  Result Value Ref Range   Glucose 134 (H) 70 - 99 mg/dL   BUN 25 8 - 27 mg/dL   Creatinine, Ser 9.60 0.57 - 1.00 mg/dL   eGFR 72 >45 WU/JWJ/1.91   BUN/Creatinine Ratio 29 (H) 12 - 28   Sodium 138 134 - 144 mmol/L   Potassium 4.8 3.5 - 5.2 mmol/L   Chloride 97 96 - 106 mmol/L   CO2 24 20 - 29 mmol/L   Calcium 9.7 8.7 - 10.3 mg/dL   Total Protein 7.3 6.0 - 8.5 g/dL   Albumin 4.6 3.9 - 4.9 g/dL   Globulin, Total 2.7 1.5 - 4.5 g/dL   Albumin/Globulin  Ratio 1.7 1.2 - 2.2   Bilirubin Total 0.7 0.0 - 1.2 mg/dL   Alkaline Phosphatase 76 44 - 121 IU/L   AST 25 0 - 40 IU/L   ALT 16 0 - 32 IU/L  Lipid panel     Status: Abnormal   Collection Time: 09/18/22  9:33 AM  Result Value Ref Range   Cholesterol, Total 209 (H) 100 - 199 mg/dL   Triglycerides 478 (H) 0 - 149 mg/dL   HDL 68 >29 mg/dL   VLDL Cholesterol Cal 30 5 - 40 mg/dL   LDL Chol Calc (NIH) 562 (H) 0 - 99 mg/dL   Chol/HDL Ratio 3.1 0.0 - 4.4 ratio    Comment:                                   T. Chol/HDL Ratio                                             Men  Women                               1/2 Avg.Risk  3.4    3.3                                   Avg.Risk  5.0    4.4                                2X Avg.Risk  9.6    7.1                                3X Avg.Risk 23.4   11.0  CBC With Differential     Status: Abnormal   Collection Time: 09/18/22  9:33 AM  Result Value Ref Range   WBC 9.1 3.4 - 10.8 x10E3/uL   RBC 5.21 3.77 - 5.28 x10E6/uL   Hemoglobin 15.7 11.1 - 15.9 g/dL   Hematocrit 16.1 (H) 09.6 - 46.6 %   MCV 94 79 - 97 fL   MCH 30.1 26.6 - 33.0 pg   MCHC 32.2 31.5 - 35.7 g/dL   RDW 04.5 40.9 - 81.1 %   Neutrophils 64 Not Estab. %   Lymphs 27 Not Estab. %   Monocytes 7 Not Estab. %   Eos 1 Not Estab. %   Basos 1 Not Estab. %   Neutrophils Absolute 5.8 1.4 - 7.0 x10E3/uL   Lymphocytes Absolute 2.5 0.7 - 3.1 x10E3/uL   Monocytes Absolute 0.6 0.1 - 0.9 x10E3/uL   EOS (ABSOLUTE) 0.1 0.0 - 0.4 x10E3/uL   Basophils Absolute 0.1 0.0 - 0.2 x10E3/uL   Immature Granulocytes 0 Not Estab. %   Immature Grans (Abs) 0.0 0.0 - 0.1 x10E3/uL      Assessment & Plan:   Problem List Items Addressed This Visit   None   No follow-ups on file.   Total time spent: 35 minutes  Orson Eva, NP  09/25/2022

## 2022-12-18 ENCOUNTER — Ambulatory Visit (INDEPENDENT_AMBULATORY_CARE_PROVIDER_SITE_OTHER): Payer: Medicare HMO | Admitting: Internal Medicine

## 2022-12-18 VITALS — BP 160/82 | HR 68 | Ht 60.0 in | Wt 162.8 lb

## 2022-12-18 DIAGNOSIS — R8290 Unspecified abnormal findings in urine: Secondary | ICD-10-CM

## 2022-12-18 DIAGNOSIS — E1165 Type 2 diabetes mellitus with hyperglycemia: Secondary | ICD-10-CM

## 2022-12-18 DIAGNOSIS — N3000 Acute cystitis without hematuria: Secondary | ICD-10-CM | POA: Diagnosis not present

## 2022-12-18 LAB — POCT URINALYSIS DIPSTICK
Bilirubin, UA: NEGATIVE
Glucose, UA: POSITIVE — AB
Ketones, UA: NEGATIVE
Nitrite, UA: NEGATIVE
Protein, UA: POSITIVE — AB
Spec Grav, UA: 1.01 (ref 1.010–1.025)
Urobilinogen, UA: 0.2 E.U./dL
pH, UA: 6 (ref 5.0–8.0)

## 2022-12-18 LAB — POCT CBG (FASTING - GLUCOSE)-MANUAL ENTRY: Glucose Fasting, POC: 155 mg/dL — AB (ref 70–99)

## 2022-12-18 MED ORDER — PHENAZOPYRIDINE HCL 100 MG PO TABS
100.0000 mg | ORAL_TABLET | Freq: Three times a day (TID) | ORAL | 0 refills | Status: AC | PRN
Start: 2022-12-18 — End: 2022-12-21

## 2022-12-18 MED ORDER — CIPROFLOXACIN HCL 500 MG PO TABS
500.0000 mg | ORAL_TABLET | Freq: Two times a day (BID) | ORAL | 0 refills | Status: AC
Start: 2022-12-18 — End: 2022-12-23

## 2022-12-18 NOTE — Progress Notes (Signed)
Established Patient Office Visit  Subjective:  Patient ID: Kathryn Poole, female    DOB: 23-Jun-1950  Age: 72 y.o. MRN: [id number]  Chief Complaint  Patient presents with   Urinary Tract Infection    burning when urinating and having to go more .    SUBJECTIVE: Kathryn Poole is a 72 y.o. female who complains of urinary frequency, urgency and dysuria x 7 days, without flank pain but fever and chills, no abnormal vaginal discharge or bleeding.        No other concerns at this time.   Past Medical History:  Diagnosis Date   Diabetes (HCC)    Thyroid disease     No past surgical history on file.  Social History   Socioeconomic History   Marital status: Single    Spouse name: Not on file   Number of children: Not on file   Years of education: Not on file   Highest education level: Not on file  Occupational History   Not on file  Tobacco Use   Smoking status: Never   Smokeless tobacco: Never  Substance and Sexual Activity   Alcohol use: No    Alcohol/week: 0.0 standard drinks of alcohol   Drug use: Not on file   Sexual activity: Not on file  Other Topics Concern   Not on file  Social History Narrative   Not on file   Social Determinants of Health   Financial Resource Strain: Not on file  Food Insecurity: Not on file  Transportation Needs: Not on file  Physical Activity: Not on file  Stress: Not on file  Social Connections: Not on file  Intimate Partner Violence: Not on file    Family History  Problem Relation Age of Onset   Breast cancer Neg Hx     No Known Allergies  Review of Systems  All other systems reviewed and are negative.      Objective:   BP (!) 160/82   Pulse 68   Ht 5' (1.524 m)   Wt 162 lb 12.8 oz (73.8 kg)   SpO2 97%   BMI 31.79 kg/m   Vitals:   12/18/22 1323  BP: (!) 160/82  Pulse: 68  Height: 5' (1.524 m)  Weight: 162 lb 12.8 oz (73.8 kg)  SpO2: 97%  BMI (Calculated): 31.79    Physical Exam Vitals reviewed.   Constitutional:      General: She is not in acute distress. HENT:     Head: Normocephalic.     Nose: Nose normal.     Mouth/Throat:     Mouth: Mucous membranes are moist.  Eyes:     Extraocular Movements: Extraocular movements intact.     Pupils: Pupils are equal, round, and reactive to light.  Cardiovascular:     Rate and Rhythm: Normal rate and regular rhythm.     Heart sounds: No murmur heard. Pulmonary:     Effort: Pulmonary effort is normal.     Breath sounds: No rhonchi or rales.  Abdominal:     General: Abdomen is flat.     Palpations: There is no hepatomegaly, splenomegaly or mass.  Musculoskeletal:        General: Normal range of motion.     Cervical back: Normal range of motion. No tenderness.  Skin:    General: Skin is warm and dry.  Neurological:     General: No focal deficit present.     Mental Status: She is alert and oriented to person, place, and  time.     Cranial Nerves: No cranial nerve deficit.     Motor: No weakness.  Psychiatric:        Mood and Affect: Mood normal.        Behavior: Behavior normal.   No CVA tenderness or inguinal adenopathy noted. Urine dipstick shows positive for WBC'Isys Tietje.  Micro exam: negative for bacteria.    Results for orders placed or performed in visit on 12/18/22  POCT CBG (Fasting - Glucose)  Result Value Ref Range   Glucose Fasting, POC 155 (A) 70 - 99 mg/dL  POCT Urinalysis Dipstick (81002)  Result Value Ref Range   Color, UA     Clarity, UA     Glucose, UA Positive (A) Negative   Bilirubin, UA Negative    Ketones, UA Negative    Spec Grav, UA 1.010 1.010 - 1.025   Blood, UA 2+    pH, UA 6.0 5.0 - 8.0   Protein, UA Positive (A) Negative   Urobilinogen, UA 0.2 0.2 or 1.0 E.U./dL   Nitrite, UA Negative    Leukocytes, UA Small (1+) (A) Negative   Appearance     Odor      Recent Results (from the past 2160 hour(Cheron Pasquarelli))  POCT CBG (Fasting - Glucose)     Status: Abnormal   Collection Time: 12/18/22  1:37 PM  Result  Value Ref Range   Glucose Fasting, POC 155 (A) 70 - 99 mg/dL  POCT Urinalysis Dipstick (82956)     Status: Abnormal   Collection Time: 12/18/22  1:52 PM  Result Value Ref Range   Color, UA     Clarity, UA     Glucose, UA Positive (A) Negative   Bilirubin, UA Negative    Ketones, UA Negative    Spec Grav, UA 1.010 1.010 - 1.025   Blood, UA 2+    pH, UA 6.0 5.0 - 8.0   Protein, UA Positive (A) Negative   Urobilinogen, UA 0.2 0.2 or 1.0 E.U./dL   Nitrite, UA Negative    Leukocytes, UA Small (1+) (A) Negative   Appearance     Odor        Assessment & Plan:   ASSESSMENT: UTI uncomplicated without evidence of pyelonephritis  PLAN: Treatment per orders - also push fluids, may use Pyridium OTC prn. Call or return to clinic prn if these symptoms worsen or fail to improve as anticipated.  Problem List Items Addressed This Visit       Endocrine   Type 2 diabetes mellitus with hyperglycemia, without long-term current use of insulin (HCC) - Primary   Relevant Orders   POCT CBG (Fasting - Glucose) (Completed)   Other Visit Diagnoses     Abnormal urine       Relevant Orders   POCT Urinalysis Dipstick (21308) (Completed)   Urine Culture   Acute cystitis without hematuria       Relevant Medications   ciprofloxacin (CIPRO) 500 MG tablet   phenazopyridine (PYRIDIUM) 100 MG tablet       Return if symptoms worsen or fail to improve.   Total time spent: 30 minutes  Luna Fuse, MD  12/18/2022   This document may have been prepared by [facility] Voice Recognition software and as such may include unintentional dictation errors.

## 2022-12-20 LAB — URINE CULTURE: Organism ID, Bacteria: NO GROWTH

## 2022-12-25 NOTE — Progress Notes (Signed)
Spoke with son DOB verified for pt and son verbalized understanding.

## 2023-01-15 ENCOUNTER — Other Ambulatory Visit: Payer: Medicare HMO

## 2023-01-15 DIAGNOSIS — E1165 Type 2 diabetes mellitus with hyperglycemia: Secondary | ICD-10-CM

## 2023-01-15 DIAGNOSIS — E782 Mixed hyperlipidemia: Secondary | ICD-10-CM

## 2023-01-15 DIAGNOSIS — E039 Hypothyroidism, unspecified: Secondary | ICD-10-CM | POA: Diagnosis not present

## 2023-01-15 DIAGNOSIS — I10 Essential (primary) hypertension: Secondary | ICD-10-CM | POA: Diagnosis not present

## 2023-01-16 ENCOUNTER — Other Ambulatory Visit: Payer: Self-pay | Admitting: Cardiology

## 2023-01-16 DIAGNOSIS — E875 Hyperkalemia: Secondary | ICD-10-CM

## 2023-01-16 NOTE — Progress Notes (Signed)
Spoke with pt daughter who verified DOB and verbalized understanding.

## 2023-01-17 ENCOUNTER — Other Ambulatory Visit: Payer: Medicare HMO

## 2023-01-17 DIAGNOSIS — E875 Hyperkalemia: Secondary | ICD-10-CM | POA: Diagnosis not present

## 2023-01-18 LAB — POTASSIUM: Potassium: 4.8 mmol/L (ref 3.5–5.2)

## 2023-01-21 ENCOUNTER — Other Ambulatory Visit: Payer: Self-pay

## 2023-01-21 MED ORDER — ALBUTEROL SULFATE HFA 108 (90 BASE) MCG/ACT IN AERS: 2.0000 | INHALATION_SPRAY | RESPIRATORY_TRACT | 0 refills | Status: DC | PRN

## 2023-01-22 ENCOUNTER — Encounter: Payer: Self-pay | Admitting: Cardiology

## 2023-01-22 ENCOUNTER — Ambulatory Visit (INDEPENDENT_AMBULATORY_CARE_PROVIDER_SITE_OTHER): Payer: Medicare HMO | Admitting: Cardiology

## 2023-01-22 VITALS — BP 132/78 | HR 81 | Ht 62.0 in | Wt 162.2 lb

## 2023-01-22 DIAGNOSIS — E039 Hypothyroidism, unspecified: Secondary | ICD-10-CM

## 2023-01-22 DIAGNOSIS — E1165 Type 2 diabetes mellitus with hyperglycemia: Secondary | ICD-10-CM

## 2023-01-22 DIAGNOSIS — E782 Mixed hyperlipidemia: Secondary | ICD-10-CM | POA: Diagnosis not present

## 2023-01-22 DIAGNOSIS — Z1231 Encounter for screening mammogram for malignant neoplasm of breast: Secondary | ICD-10-CM | POA: Diagnosis not present

## 2023-01-22 DIAGNOSIS — Z1211 Encounter for screening for malignant neoplasm of colon: Secondary | ICD-10-CM | POA: Diagnosis not present

## 2023-01-22 MED ORDER — LEVOTHYROXINE SODIUM 150 MCG PO TABS: 150.0000 ug | ORAL_TABLET | Freq: Every day | ORAL | 11 refills | Status: DC

## 2023-01-22 MED ORDER — PIOGLITAZONE HCL 30 MG PO TABS: 30.0000 mg | ORAL_TABLET | Freq: Every day | ORAL | 11 refills | Status: DC

## 2023-01-22 MED ORDER — ROSUVASTATIN CALCIUM 40 MG PO CPSP: 40.0000 mg | ORAL_CAPSULE | Freq: Every day | ORAL | 6 refills | Status: DC

## 2023-01-22 NOTE — Patient Instructions (Addendum)
Increase levothyroxine to 150 mcg Increase rosuvastatin to 40 mg Increase Actos to 30 MG Mammogram Cologuard for colon cancer screening

## 2023-01-22 NOTE — Progress Notes (Signed)
Established Patient Office Visit  Subjective:  Patient ID: Kathryn Poole, female    DOB: 09/09/1950  Age: 72 y.o. MRN: [id number]  Chief Complaint  Patient presents with   Follow-up    4 month follow up    Patient in office for 4 month follow up, discuss recent lab work.  Cologuard 05/02/2015 negative, Will place new order.  Over due for mammogram, order placed.  Hgb A1c elevated, increase Actos to 30 mg. TSH uncontrolled, increase levothyroxine to 150 mcg. Lipid panel abnormal, increase rosuvastatin to 40 mg daily.     No other concerns at this time.   Past Medical History:  Diagnosis Date   Diabetes (HCC)    Thyroid disease     No past surgical history on file.  Social History   Socioeconomic History   Marital status: Single    Spouse name: Not on file   Number of children: Not on file   Years of education: Not on file   Highest education level: Not on file  Occupational History   Not on file  Tobacco Use   Smoking status: Never   Smokeless tobacco: Never  Substance and Sexual Activity   Alcohol use: No    Alcohol/week: 0.0 standard drinks of alcohol   Drug use: Not on file   Sexual activity: Not on file  Other Topics Concern   Not on file  Social History Narrative   Not on file   Social Determinants of Health   Financial Resource Strain: Not on file  Food Insecurity: Not on file  Transportation Needs: Not on file  Physical Activity: Not on file  Stress: Not on file  Social Connections: Not on file  Intimate Partner Violence: Not on file    Family History  Problem Relation Age of Onset   Breast cancer Neg Hx     No Known Allergies  Review of Systems  Constitutional: Negative.   HENT: Negative.    Eyes: Negative.   Respiratory: Negative.  Negative for shortness of breath.   Cardiovascular: Negative.  Negative for chest pain.  Gastrointestinal: Negative.  Negative for abdominal pain, constipation and diarrhea.  Genitourinary: Negative.    Musculoskeletal:  Negative for joint pain and myalgias.  Skin: Negative.   Neurological: Negative.  Negative for dizziness and headaches.  Endo/Heme/Allergies: Negative.   All other systems reviewed and are negative.      Objective:   BP 132/78   Pulse 81   Ht 5\' 2"  (1.575 m)   Wt 162 lb 3.2 oz (73.6 kg)   SpO2 97%   BMI 29.67 kg/m   Vitals:   01/22/23 0857  BP: 132/78  Pulse: 81  Height: 5\' 2"  (1.575 m)  Weight: 162 lb 3.2 oz (73.6 kg)  SpO2: 97%  BMI (Calculated): 29.66    Physical Exam Vitals and nursing note reviewed.  Constitutional:      Appearance: Normal appearance. She is normal weight.  HENT:     Head: Normocephalic and atraumatic.     Nose: Nose normal.     Mouth/Throat:     Mouth: Mucous membranes are moist.  Eyes:     Extraocular Movements: Extraocular movements intact.     Conjunctiva/sclera: Conjunctivae normal.     Pupils: Pupils are equal, round, and reactive to light.  Cardiovascular:     Rate and Rhythm: Normal rate and regular rhythm.     Pulses: Normal pulses.     Heart sounds: Normal heart sounds.  Pulmonary:  Effort: Pulmonary effort is normal.     Breath sounds: Normal breath sounds.  Abdominal:     General: Abdomen is flat. Bowel sounds are normal.     Palpations: Abdomen is soft.  Musculoskeletal:        General: Normal range of motion.     Cervical back: Normal range of motion.  Skin:    General: Skin is warm and dry.  Neurological:     General: No focal deficit present.     Mental Status: She is alert and oriented to person, place, and time.  Psychiatric:        Mood and Affect: Mood normal.        Behavior: Behavior normal.        Thought Content: Thought content normal.        Judgment: Judgment normal.      No results found for any visits on 01/22/23.  Recent Results (from the past 2160 hour(s))  POCT CBG (Fasting - Glucose)     Status: Abnormal   Collection Time: 12/18/22  1:37 PM  Result Value Ref Range    Glucose Fasting, POC 155 (A) 70 - 99 mg/dL  POCT Urinalysis Dipstick (78295)     Status: Abnormal   Collection Time: 12/18/22  1:52 PM  Result Value Ref Range   Color, UA     Clarity, UA     Glucose, UA Positive (A) Negative   Bilirubin, UA Negative    Ketones, UA Negative    Spec Grav, UA 1.010 1.010 - 1.025   Blood, UA 2+    pH, UA 6.0 5.0 - 8.0   Protein, UA Positive (A) Negative   Urobilinogen, UA 0.2 0.2 or 1.0 E.U./dL   Nitrite, UA Negative    Leukocytes, UA Small (1+) (A) Negative   Appearance     Odor    Urine Culture     Status: None   Collection Time: 12/18/22  2:26 PM   Specimen: Urine   UC  Result Value Ref Range   Urine Culture, Routine Final report    Organism ID, Bacteria No growth   Hemoglobin A1c     Status: Abnormal   Collection Time: 01/15/23  8:38 AM  Result Value Ref Range   Hgb A1c MFr Bld 8.4 (H) 4.8 - 5.6 %    Comment:          Prediabetes: 5.7 - 6.4          Diabetes: >6.4          Glycemic control for adults with diabetes: <7.0    Est. average glucose Bld gHb Est-mCnc 194 mg/dL  TSH     Status: Abnormal   Collection Time: 01/15/23  8:38 AM  Result Value Ref Range   TSH 9.220 (H) 0.450 - 4.500 uIU/mL  CMP14+EGFR     Status: Abnormal   Collection Time: 01/15/23  8:38 AM  Result Value Ref Range   Glucose 157 (H) 70 - 99 mg/dL   BUN 27 8 - 27 mg/dL   Creatinine, Ser 6.21 0.57 - 1.00 mg/dL   eGFR 84 >30 QM/VHQ/4.69   BUN/Creatinine Ratio 36 (H) 12 - 28   Sodium 140 134 - 144 mmol/L   Potassium 5.6 (H) 3.5 - 5.2 mmol/L   Chloride 100 96 - 106 mmol/L   CO2 24 20 - 29 mmol/L   Calcium 9.9 8.7 - 10.3 mg/dL   Total Protein 7.5 6.0 - 8.5 g/dL   Albumin 4.6  3.8 - 4.8 g/dL   Globulin, Total 2.9 1.5 - 4.5 g/dL   Bilirubin Total 0.6 0.0 - 1.2 mg/dL   Alkaline Phosphatase 89 44 - 121 IU/L   AST 20 0 - 40 IU/L   ALT 11 0 - 32 IU/L  Lipid panel     Status: Abnormal   Collection Time: 01/15/23  8:38 AM  Result Value Ref Range   Cholesterol, Total  293 (H) 100 - 199 mg/dL   Triglycerides 161 (H) 0 - 149 mg/dL   HDL 62 >09 mg/dL   VLDL Cholesterol Cal 48 (H) 5 - 40 mg/dL   LDL Chol Calc (NIH) 604 (H) 0 - 99 mg/dL   LDL CALC COMMENT: Comment     Comment: Consider evaluating for Familial Hypercholesterolemia(FH), if clinically indicated.    Chol/HDL Ratio 4.7 (H) 0.0 - 4.4 ratio    Comment:                                   T. Chol/HDL Ratio                                             Men  Women                               1/2 Avg.Risk  3.4    3.3                                   Avg.Risk  5.0    4.4                                2X Avg.Risk  9.6    7.1                                3X Avg.Risk 23.4   11.0   CBC with Diff     Status: Abnormal   Collection Time: 01/15/23  8:38 AM  Result Value Ref Range   WBC 5.9 3.4 - 10.8 x10E3/uL   RBC 5.28 3.77 - 5.28 x10E6/uL   Hemoglobin 15.3 11.1 - 15.9 g/dL   Hematocrit 54.0 (H) 98.1 - 46.6 %   MCV 92 79 - 97 fL   MCH 29.0 26.6 - 33.0 pg   MCHC 31.4 (L) 31.5 - 35.7 g/dL   RDW 19.1 47.8 - 29.5 %   Platelets 236 150 - 450 x10E3/uL   Neutrophils 49 Not Estab. %   Lymphs 39 Not Estab. %   Monocytes 9 Not Estab. %   Eos 2 Not Estab. %   Basos 1 Not Estab. %   Neutrophils Absolute 2.9 1.4 - 7.0 x10E3/uL   Lymphocytes Absolute 2.3 0.7 - 3.1 x10E3/uL   Monocytes Absolute 0.5 0.1 - 0.9 x10E3/uL   EOS (ABSOLUTE) 0.1 0.0 - 0.4 x10E3/uL   Basophils Absolute 0.1 0.0 - 0.2 x10E3/uL   Immature Granulocytes 0 Not Estab. %   Immature Grans (Abs) 0.0 0.0 - 0.1 x10E3/uL  Potassium     Status: None   Collection Time: 01/17/23  8:39 AM  Result Value Ref Range   Potassium 4.8 3.5 - 5.2 mmol/L      Assessment & Plan:  Mammogram order placed. Cologuard order placed. Increase Actos Increase levothyroxine Increase rosuvastatin  Problem List Items Addressed This Visit       Endocrine   Type 2 diabetes mellitus with hyperglycemia, without long-term current use of insulin (HCC) - Primary    Relevant Medications   Rosuvastatin Calcium 40 MG CPSP   pioglitazone (ACTOS) 30 MG tablet   Primary hypothyroidism   Relevant Medications   levothyroxine (SYNTHROID) 150 MCG tablet     Other   Mixed hyperlipidemia   Relevant Medications   Rosuvastatin Calcium 40 MG CPSP   Other Visit Diagnoses     Encounter for screening mammogram for malignant neoplasm of breast       Relevant Orders   MM 3D SCREENING MAMMOGRAM BILATERAL BREAST   Colon cancer screening       Relevant Orders   Cologuard       Return in about 4 months (around 05/24/2023) for with fasting labs prior.   Total time spent: 25 minutes  Google, NP  01/22/2023   This document may have been prepared by Dragon Voice Recognition software and as such may include unintentional dictation errors.

## 2023-01-23 ENCOUNTER — Other Ambulatory Visit: Payer: Self-pay | Admitting: Cardiology

## 2023-01-23 MED ORDER — ROSUVASTATIN CALCIUM 40 MG PO TABS: 40.0000 mg | ORAL_TABLET | Freq: Every day | ORAL | 11 refills | Status: DC

## 2023-01-28 DIAGNOSIS — Z1211 Encounter for screening for malignant neoplasm of colon: Secondary | ICD-10-CM | POA: Diagnosis not present

## 2023-02-05 ENCOUNTER — Other Ambulatory Visit: Payer: Self-pay

## 2023-02-05 MED ORDER — MONTELUKAST SODIUM 10 MG PO TABS: 10.0000 mg | ORAL_TABLET | Freq: Every day | ORAL | 2 refills | Status: AC

## 2023-02-06 ENCOUNTER — Other Ambulatory Visit: Payer: Self-pay | Admitting: Cardiology

## 2023-02-06 DIAGNOSIS — Z1211 Encounter for screening for malignant neoplasm of colon: Secondary | ICD-10-CM

## 2023-02-06 LAB — COLOGUARD: COLOGUARD: POSITIVE — AB

## 2023-02-11 NOTE — Progress Notes (Signed)
Patient notified

## 2023-03-10 ENCOUNTER — Other Ambulatory Visit: Payer: Self-pay | Admitting: Cardiology

## 2023-03-24 DIAGNOSIS — S86911A Strain of unspecified muscle(s) and tendon(s) at lower leg level, right leg, initial encounter: Secondary | ICD-10-CM | POA: Diagnosis not present

## 2023-03-24 DIAGNOSIS — M25561 Pain in right knee: Secondary | ICD-10-CM | POA: Diagnosis not present

## 2023-04-07 DIAGNOSIS — S86911A Strain of unspecified muscle(s) and tendon(s) at lower leg level, right leg, initial encounter: Secondary | ICD-10-CM | POA: Diagnosis not present

## 2023-04-11 ENCOUNTER — Ambulatory Visit: Payer: Medicare HMO | Admitting: Cardiology

## 2023-04-12 ENCOUNTER — Encounter: Payer: Self-pay | Admitting: Cardiology

## 2023-04-12 ENCOUNTER — Ambulatory Visit (INDEPENDENT_AMBULATORY_CARE_PROVIDER_SITE_OTHER): Payer: Medicare HMO | Admitting: Cardiology

## 2023-04-12 VITALS — BP 120/80 | HR 97 | Ht 66.0 in | Wt 167.0 lb

## 2023-04-12 DIAGNOSIS — M25561 Pain in right knee: Secondary | ICD-10-CM | POA: Diagnosis not present

## 2023-04-12 DIAGNOSIS — Z0130 Encounter for examination of blood pressure without abnormal findings: Secondary | ICD-10-CM

## 2023-04-12 NOTE — Progress Notes (Signed)
Established Patient Office Visit  Subjective:  Patient ID: Kathryn Poole, female    DOB: [birth year]  Age: 72 y.o. MRN: [id number]  Chief Complaint  Patient presents with   Acute Visit    Pain in right leg    Patient in office complaining of pain in right knee. Son acts as an Engineer, technical sales. Patient reports pain started on outer side of right knee, now the pain is more medial. Patient denies trauma to knee. Patient taking flexeril and Voltaren with no relief. Knee xray at urgent care normal, will order an MRI.     No other concerns at this time.   Past Medical History:  Diagnosis Date   Diabetes [facility])    Thyroid disease     History reviewed. No pertinent surgical history.  Social History   Socioeconomic History   Marital status: Single    Spouse name: Not on file   Number of children: Not on file   Years of education: Not on file   Highest education level: Not on file  Occupational History   Not on file  Tobacco Use   Smoking status: Never   Smokeless tobacco: Never  Substance and Sexual Activity   Alcohol use: No    Alcohol/week: 0.0 standard drinks of alcohol   Drug use: Not on file   Sexual activity: Not on file  Other Topics Concern   Not on file  Social History Narrative   Not on file   Social Determinants of Health   Financial Resource Strain: Not on file  Food Insecurity: Not on file  Transportation Needs: Not on file  Physical Activity: Not on file  Stress: Not on file  Social Connections: Not on file  Intimate Partner Violence: Not on file    Family History  Problem Relation Age of Onset   Breast cancer Neg Hx     No Known Allergies  Review of Systems  Constitutional: Negative.   HENT: Negative.    Eyes: Negative.   Respiratory: Negative.  Negative for shortness of breath.   Cardiovascular: Negative.  Negative for chest pain.  Gastrointestinal: Negative.  Negative for abdominal pain, constipation and diarrhea.  Genitourinary: Negative.    Musculoskeletal:  Positive for joint pain. Negative for myalgias.  Skin: Negative.   Neurological: Negative.  Negative for dizziness and headaches.  Endo/Heme/Allergies: Negative.   All other systems reviewed and are negative.      Objective:   BP 120/80   Pulse 97   Ht 5\' 6"  (1.676 m)   Wt 167 lb (75.8 kg)   SpO2 99%   BMI 26.95 kg/m   Vitals:   04/12/23 0941  BP: 120/80  Pulse: 97  Height: 5\' 6"  (1.676 m)  Weight: 167 lb (75.8 kg)  SpO2: 99%  BMI (Calculated): 26.97    Physical Exam Vitals and nursing note reviewed.  Constitutional:      Appearance: Normal appearance. She is normal weight.  HENT:     Head: Normocephalic and atraumatic.     Nose: Nose normal.     Mouth/Throat:     Mouth: Mucous membranes are moist.  Eyes:     Extraocular Movements: Extraocular movements intact.     Conjunctiva/sclera: Conjunctivae normal.     Pupils: Pupils are equal, round, and reactive to light.  Cardiovascular:     Rate and Rhythm: Normal rate and regular rhythm.     Pulses: Normal pulses.     Heart sounds: Normal heart sounds.  Pulmonary:  Effort: Pulmonary effort is normal.     Breath sounds: Normal breath sounds.  Abdominal:     General: Abdomen is flat. Bowel sounds are normal.     Palpations: Abdomen is soft.  Musculoskeletal:        General: Normal range of motion.     Cervical back: Normal range of motion.     Right knee: No swelling, effusion, erythema or bony tenderness. Normal range of motion.       Legs:  Skin:    General: Skin is warm and dry.  Neurological:     General: No focal deficit present.     Mental Status: She is alert and oriented to person, place, and time.  Psychiatric:        Mood and Affect: Mood normal.        Behavior: Behavior normal.        Thought Content: Thought content normal.        Judgment: Judgment normal.      No results found for any visits on 04/12/23.  Recent Results (from the past 2160 hour(s))  Hemoglobin  A1c     Status: Abnormal   Collection Time: 01/15/23  8:38 AM  Result Value Ref Range   Hgb A1c MFr Bld 8.4 (H) 4.8 - 5.6 %    Comment:          Prediabetes: 5.7 - 6.4          Diabetes: >6.4          Glycemic control for adults with diabetes: <7.0    Est. average glucose Bld gHb Est-mCnc 194 mg/dL  TSH     Status: Abnormal   Collection Time: 01/15/23  8:38 AM  Result Value Ref Range   TSH 9.220 (H) 0.450 - 4.500 uIU/mL  CMP14+EGFR     Status: Abnormal   Collection Time: 01/15/23  8:38 AM  Result Value Ref Range   Glucose 157 (H) 70 - 99 mg/dL   BUN 27 8 - 27 mg/dL   Creatinine, Ser 9.56 0.57 - 1.00 mg/dL   eGFR 84 >38 VF/IEP/3.29   BUN/Creatinine Ratio 36 (H) 12 - 28   Sodium 140 134 - 144 mmol/L   Potassium 5.6 (H) 3.5 - 5.2 mmol/L   Chloride 100 96 - 106 mmol/L   CO2 24 20 - 29 mmol/L   Calcium 9.9 8.7 - 10.3 mg/dL   Total Protein 7.5 6.0 - 8.5 g/dL   Albumin 4.6 3.8 - 4.8 g/dL   Globulin, Total 2.9 1.5 - 4.5 g/dL   Bilirubin Total 0.6 0.0 - 1.2 mg/dL   Alkaline Phosphatase 89 44 - 121 IU/L   AST 20 0 - 40 IU/L   ALT 11 0 - 32 IU/L  Lipid panel     Status: Abnormal   Collection Time: 01/15/23  8:38 AM  Result Value Ref Range   Cholesterol, Total 293 (H) 100 - 199 mg/dL   Triglycerides 518 (H) 0 - 149 mg/dL   HDL 62 >84 mg/dL   VLDL Cholesterol Cal 48 (H) 5 - 40 mg/dL   LDL Chol Calc (NIH) 166 (H) 0 - 99 mg/dL   LDL CALC COMMENT: Comment     Comment: Consider evaluating for Familial Hypercholesterolemia(FH), if clinically indicated.    Chol/HDL Ratio 4.7 (H) 0.0 - 4.4 ratio    Comment:  T. Chol/HDL Ratio                                             Men  Women                               1/2 Avg.Risk  3.4    3.3                                   Avg.Risk  5.0    4.4                                2X Avg.Risk  9.6    7.1                                3X Avg.Risk 23.4   11.0   CBC with Diff     Status: Abnormal   Collection Time:  01/15/23  8:38 AM  Result Value Ref Range   WBC 5.9 3.4 - 10.8 x10E3/uL   RBC 5.28 3.77 - 5.28 x10E6/uL   Hemoglobin 15.3 11.1 - 15.9 g/dL   Hematocrit 19.1 (H) 47.8 - 46.6 %   MCV 92 79 - 97 fL   MCH 29.0 26.6 - 33.0 pg   MCHC 31.4 (L) 31.5 - 35.7 g/dL   RDW 29.5 62.1 - 30.8 %   Platelets 236 150 - 450 x10E3/uL   Neutrophils 49 Not Estab. %   Lymphs 39 Not Estab. %   Monocytes 9 Not Estab. %   Eos 2 Not Estab. %   Basos 1 Not Estab. %   Neutrophils Absolute 2.9 1.4 - 7.0 x10E3/uL   Lymphocytes Absolute 2.3 0.7 - 3.1 x10E3/uL   Monocytes Absolute 0.5 0.1 - 0.9 x10E3/uL   EOS (ABSOLUTE) 0.1 0.0 - 0.4 x10E3/uL   Basophils Absolute 0.1 0.0 - 0.2 x10E3/uL   Immature Granulocytes 0 Not Estab. %   Immature Grans (Abs) 0.0 0.0 - 0.1 x10E3/uL  Potassium     Status: None   Collection Time: 01/17/23  8:39 AM  Result Value Ref Range   Potassium 4.8 3.5 - 5.2 mmol/L  Cologuard     Status: Abnormal   Collection Time: 01/28/23  9:00 AM  Result Value Ref Range   COLOGUARD Positive (A) Negative    Comment:  POSITIVE TEST RESULT. A positive Cologuard result should be followed with a colonoscopy or visual examination of the colon. The normal value (reference range) for this assay is negative.  TEST DESCRIPTION: Composite algorithmic analysis of stool DNA-biomarkers with hemoglobin immunoassay.   Quantitative values of individual biomarkers are not reportable and are not associated with individual biomarker result reference ranges. Cologuard is intended for colorectal cancer screening of adults of either sex, 45 years or older, who are at average-risk for colorectal cancer (CRC). Cologuard has been approved for use by the [place] FDA. The performance of Cologuard was established in a cross sectional study of average-risk adults aged 2-84. Cologuard performance in patients ages 74 to 28 years was estimated by sub-group analysis of near-age groups. Colonoscopies performed for a positive result may find as  the most clinically significant lesion: colorectal cancer [4.0%], advanced adenoma  (including sessile serrated polyps greater than or equal to 1cm diameter) [20%] or non- advanced adenoma [31%]; or no colorectal neoplasia [45%]. These estimates are derived from a prospective cross-sectional screening study of 10,000 individuals at average risk for colorectal cancer who were screened with both Cologuard and colonoscopy. (Imperiale T. et al, [facility]acy Mis J Med 2014;370(14):1286-1297.) Cologuard may produce a false negative or false positive result (no colorectal cancer or precancerous polyp present at colonoscopy follow up). A negative Cologuard test result does not guarantee the absence of CRC or advanced adenoma (pre-cancer). The current Cologuard screening interval is every 3 years. Science writer and [place] Therapist, music). Cologuard performance data in a 10,000 patient pivotal study using colonoscopy as the reference method can be accessed at the following location: www.[employer].com/results. Additional description of the Cologuard test process,  warnings and precautions can be found at www.cologuard.com.       Assessment & Plan:  Continue rest, ice, elevation, flexeril, Voltaren.  MRI of right knee.   Problem List Items Addressed This Visit   None Visit Diagnoses     Acute pain of right knee    -  Primary   Relevant Orders   MR Knee Right Wo Contrast       Return in about 4 weeks (around 05/10/2023).   Total time spent: 25 minutes  [employer], NP  04/12/2023   This document may have been prepared by Dragon Voice Recognition software and as such may include unintentional dictation errors.

## 2023-04-23 ENCOUNTER — Ambulatory Visit
Admission: RE | Admit: 2023-04-23 | Discharge: 2023-04-23 | Disposition: A | Discharge status: Home / Self Care | Payer: Medicare HMO | Source: Ambulatory Visit | Attending: Cardiology | Admitting: Cardiology

## 2023-04-23 DIAGNOSIS — S82144A Nondisplaced bicondylar fracture of right tibia, initial encounter for closed fracture: Secondary | ICD-10-CM | POA: Diagnosis not present

## 2023-04-23 DIAGNOSIS — M25561 Pain in right knee: Secondary | ICD-10-CM | POA: Diagnosis not present

## 2023-04-23 DIAGNOSIS — S83241A Other tear of medial meniscus, current injury, right knee, initial encounter: Secondary | ICD-10-CM | POA: Diagnosis not present

## 2023-04-24 ENCOUNTER — Other Ambulatory Visit: Payer: Self-pay | Admitting: Cardiology

## 2023-04-29 ENCOUNTER — Other Ambulatory Visit: Payer: Self-pay

## 2023-04-29 MED ORDER — PIOGLITAZONE HCL 30 MG PO TABS: 30.0000 mg | ORAL_TABLET | Freq: Every day | ORAL | 11 refills | Status: DC

## 2023-04-30 ENCOUNTER — Other Ambulatory Visit: Payer: Self-pay

## 2023-05-13 ENCOUNTER — Other Ambulatory Visit: Payer: Self-pay | Admitting: Cardiology

## 2023-05-13 DIAGNOSIS — M25561 Pain in right knee: Secondary | ICD-10-CM

## 2023-05-20 ENCOUNTER — Other Ambulatory Visit: Payer: Medicare HMO

## 2023-05-20 DIAGNOSIS — E039 Hypothyroidism, unspecified: Secondary | ICD-10-CM

## 2023-05-20 DIAGNOSIS — E1165 Type 2 diabetes mellitus with hyperglycemia: Secondary | ICD-10-CM

## 2023-05-20 DIAGNOSIS — E782 Mixed hyperlipidemia: Secondary | ICD-10-CM

## 2023-05-21 ENCOUNTER — Other Ambulatory Visit: Payer: Medicare HMO

## 2023-05-21 DIAGNOSIS — E782 Mixed hyperlipidemia: Secondary | ICD-10-CM | POA: Diagnosis not present

## 2023-05-21 DIAGNOSIS — E1165 Type 2 diabetes mellitus with hyperglycemia: Secondary | ICD-10-CM | POA: Diagnosis not present

## 2023-05-21 DIAGNOSIS — E039 Hypothyroidism, unspecified: Secondary | ICD-10-CM | POA: Diagnosis not present

## 2023-05-22 ENCOUNTER — Other Ambulatory Visit: Payer: Self-pay | Admitting: Cardiology

## 2023-05-22 DIAGNOSIS — E875 Hyperkalemia: Secondary | ICD-10-CM

## 2023-05-22 LAB — CMP14+EGFR
ALT: 12 [IU]/L (ref 0–32)
AST: 21 [IU]/L (ref 0–40)
Albumin: 4.5 g/dL (ref 3.8–4.8)
Alkaline Phosphatase: 84 [IU]/L (ref 44–121)
BUN/Creatinine Ratio: 31 — ABNORMAL HIGH (ref 12–28)
BUN: 25 mg/dL (ref 8–27)
Bilirubin Total: 0.7 mg/dL (ref 0.0–1.2)
CO2: 25 mmol/L (ref 20–29)
Calcium: 9.9 mg/dL (ref 8.7–10.3)
Chloride: 102 mmol/L (ref 96–106)
Creatinine, Ser: 0.8 mg/dL (ref 0.57–1.00)
Globulin, Total: 2.6 g/dL (ref 1.5–4.5)
Glucose: 135 mg/dL — ABNORMAL HIGH (ref 70–99)
Potassium: 5.4 mmol/L — ABNORMAL HIGH (ref 3.5–5.2)
Sodium: 140 mmol/L (ref 134–144)
Total Protein: 7.1 g/dL (ref 6.0–8.5)
eGFR: 79 mL/min/{1.73_m2} (ref >59)

## 2023-05-22 LAB — CBC WITH DIFFERENTIAL/PLATELET
Basophils Absolute: 0.1 10*3/uL (ref 0.0–0.2)
Basos: 1 %
EOS (ABSOLUTE): 0.1 10*3/uL (ref 0.0–0.4)
Eos: 2 %
Hematocrit: 45.1 % (ref 34.0–46.6)
Hemoglobin: 14.7 g/dL (ref 11.1–15.9)
Immature Grans (Abs): 0 10*3/uL (ref 0.0–0.1)
Immature Granulocytes: 0 %
Lymphocytes Absolute: 1.7 10*3/uL (ref 0.7–3.1)
Lymphs: 35 %
MCH: 30.3 pg (ref 26.6–33.0)
MCHC: 32.6 g/dL (ref 31.5–35.7)
MCV: 93 fL (ref 79–97)
Monocytes Absolute: 0.4 10*3/uL (ref 0.1–0.9)
Monocytes: 9 %
Neutrophils Absolute: 2.6 10*3/uL (ref 1.4–7.0)
Neutrophils: 53 %
Platelets: 273 10*3/uL (ref 150–450)
RBC: 4.85 x10E6/uL (ref 3.77–5.28)
RDW: 13.9 % (ref 11.7–15.4)
WBC: 4.9 10*3/uL (ref 3.4–10.8)

## 2023-05-22 LAB — TSH: TSH: 5.88 u[IU]/mL — ABNORMAL HIGH (ref 0.450–4.500)

## 2023-05-22 LAB — LIPID PANEL
Chol/HDL Ratio: 3.7 {ratio} (ref 0.0–4.4)
Cholesterol, Total: 232 mg/dL — ABNORMAL HIGH (ref 100–199)
HDL: 63 mg/dL (ref >39)
LDL Chol Calc (NIH): 134 mg/dL — ABNORMAL HIGH (ref 0–99)
Triglycerides: 197 mg/dL — ABNORMAL HIGH (ref 0–149)
VLDL Cholesterol Cal: 35 mg/dL (ref 5–40)

## 2023-05-22 LAB — HEMOGLOBIN A1C
Est. average glucose Bld gHb Est-mCnc: 186 mg/dL
Hgb A1c MFr Bld: 8.1 % — ABNORMAL HIGH (ref 4.8–5.6)

## 2023-05-23 MED ORDER — INVOKANA 300 MG PO TABS: 300.0000 mg | ORAL_TABLET | Freq: Every day | ORAL | 1 refills | Status: DC

## 2023-05-23 MED ORDER — METFORMIN HCL 1000 MG PO TABS: 1000.0000 mg | ORAL_TABLET | Freq: Two times a day (BID) | ORAL | 1 refills | Status: AC

## 2023-05-27 ENCOUNTER — Encounter: Payer: Self-pay | Admitting: Cardiology

## 2023-05-27 ENCOUNTER — Ambulatory Visit (INDEPENDENT_AMBULATORY_CARE_PROVIDER_SITE_OTHER): Payer: Medicare HMO | Admitting: Cardiology

## 2023-05-27 VITALS — BP 136/84 | HR 78 | Ht 60.0 in | Wt 170.0 lb

## 2023-05-27 DIAGNOSIS — E039 Hypothyroidism, unspecified: Secondary | ICD-10-CM

## 2023-05-27 DIAGNOSIS — E782 Mixed hyperlipidemia: Secondary | ICD-10-CM

## 2023-05-27 DIAGNOSIS — I10 Essential (primary) hypertension: Secondary | ICD-10-CM

## 2023-05-27 DIAGNOSIS — E875 Hyperkalemia: Secondary | ICD-10-CM | POA: Diagnosis not present

## 2023-05-27 DIAGNOSIS — E1165 Type 2 diabetes mellitus with hyperglycemia: Secondary | ICD-10-CM

## 2023-05-27 MED ORDER — LEVOTHYROXINE SODIUM 200 MCG PO TABS: 200.0000 ug | ORAL_TABLET | Freq: Every day | ORAL | 11 refills | Status: DC

## 2023-05-27 NOTE — Progress Notes (Signed)
Established Patient Office Visit  Subjective:  Patient ID: Kathryn Poole, female    DOB: 02-07-1951  Age: 72 y.o. MRN: [id number]  No chief complaint on file.   Patient in office for 4 month follow up, discuss recent lab results. Patient doing well. No complaints today. States knee pain has resolved. Discussed recent lab work. TSH not controlled, will increase levothyroxine. HGB A1c and LDL improving.  Potassium elevated, will recheck today.     No other concerns at this time.   Past Medical History:  Diagnosis Date   Diabetes [facility])    Thyroid disease     History reviewed. No pertinent surgical history.  Social History   Socioeconomic History   Marital status: Single    Spouse name: Not on file   Number of children: Not on file   Years of education: Not on file   Highest education level: Not on file  Occupational History   Not on file  Tobacco Use   Smoking status: Never   Smokeless tobacco: Never  Substance and Sexual Activity   Alcohol use: No    Alcohol/week: 0.0 standard drinks of alcohol   Drug use: Not on file   Sexual activity: Not on file  Other Topics Concern   Not on file  Social History Narrative   Not on file   Social Drivers of Health   Financial Resource Strain: Not on file  Food Insecurity: Not on file  Transportation Needs: Not on file  Physical Activity: Not on file  Stress: Not on file  Social Connections: Not on file  Intimate Partner Violence: Not on file    Family History  Problem Relation Age of Onset   Breast cancer Neg Hx     No Known Allergies  Outpatient Medications Prior to Visit  Medication Sig   albuterol (VENTOLIN HFA) 108 (90 Base) MCG/ACT inhaler INHALE 2 PUFFS INTO THE LUNGS EVERY 4 HOURS AS NEEDED   cyclobenzaprine (FLEXERIL) 5 MG tablet Take 5 mg by mouth 3 (three) times daily.   diclofenac (VOLTAREN) 75 MG EC tablet Take 75 mg by mouth 2 (two) times daily.   fenofibrate (TRICOR) 145 MG tablet Take 145 mg by mouth  daily.   INVOKANA 300 MG TABS tablet Take 1 tablet (300 mg total) by mouth daily.   losartan (COZAAR) 100 MG tablet Take 100 mg by mouth daily.   metFORMIN (GLUCOPHAGE) 1000 MG tablet Take 1 tablet (1,000 mg total) by mouth 2 (two) times daily.   montelukast (SINGULAIR) 10 MG tablet Take 1 tablet (10 mg total) by mouth at bedtime.   pioglitazone (ACTOS) 30 MG tablet Take 1 tablet (30 mg total) by mouth daily.   rosuvastatin (CRESTOR) 40 MG tablet Take 1 tablet (40 mg total) by mouth daily.   [DISCONTINUED] levothyroxine (SYNTHROID) 150 MCG tablet Take 1 tablet (150 mcg total) by mouth daily.   No facility-administered medications prior to visit.    Review of Systems  Constitutional: Negative.   HENT: Negative.    Eyes: Negative.   Respiratory: Negative.  Negative for shortness of breath.   Cardiovascular: Negative.  Negative for chest pain.  Gastrointestinal: Negative.  Negative for abdominal pain, constipation and diarrhea.  Genitourinary: Negative.   Musculoskeletal:  Negative for joint pain and myalgias.  Skin: Negative.   Neurological: Negative.  Negative for dizziness and headaches.  Endo/Heme/Allergies: Negative.   All other systems reviewed and are negative.      Objective:   BP 136/84  Pulse 78   Ht 5' (1.524 m)   Wt 170 lb (77.1 kg)   SpO2 98%   BMI 33.20 kg/m   Vitals:   05/27/23 0901  BP: 136/84  Pulse: 78  Height: 5' (1.524 m)  Weight: 170 lb (77.1 kg)  SpO2: 98%  BMI (Calculated): 33.2    Physical Exam Vitals and nursing note reviewed.  Constitutional:      Appearance: Normal appearance. She is normal weight.  HENT:     Head: Normocephalic and atraumatic.     Nose: Nose normal.     Mouth/Throat:     Mouth: Mucous membranes are moist.  Eyes:     Extraocular Movements: Extraocular movements intact.     Conjunctiva/sclera: Conjunctivae normal.     Pupils: Pupils are equal, round, and reactive to light.  Cardiovascular:     Rate and Rhythm:  Normal rate and regular rhythm.     Pulses: Normal pulses.     Heart sounds: Normal heart sounds.  Pulmonary:     Effort: Pulmonary effort is normal.     Breath sounds: Normal breath sounds.  Abdominal:     General: Abdomen is flat. Bowel sounds are normal.     Palpations: Abdomen is soft.  Musculoskeletal:        General: Normal range of motion.     Cervical back: Normal range of motion.  Skin:    General: Skin is warm and dry.  Neurological:     General: No focal deficit present.     Mental Status: She is alert and oriented to person, place, and time.  Psychiatric:        Mood and Affect: Mood normal.        Behavior: Behavior normal.        Thought Content: Thought content normal.        Judgment: Judgment normal.      No results found for any visits on 05/27/23.  Recent Results (from the past 2160 hours)  Hemoglobin A1c     Status: Abnormal   Collection Time: 05/21/23  8:50 AM  Result Value Ref Range   Hgb A1c MFr Bld 8.1 (H) 4.8 - 5.6 %    Comment:          Prediabetes: 5.7 - 6.4          Diabetes: >6.4          Glycemic control for adults with diabetes: <7.0    Est. average glucose Bld gHb Est-mCnc 186 mg/dL  TSH     Status: Abnormal   Collection Time: 05/21/23  8:50 AM  Result Value Ref Range   TSH 5.880 (H) 0.450 - 4.500 uIU/mL  CMP14+EGFR     Status: Abnormal   Collection Time: 05/21/23  8:50 AM  Result Value Ref Range   Glucose 135 (H) 70 - 99 mg/dL   BUN 25 8 - 27 mg/dL   Creatinine, Ser 1.61 0.57 - 1.00 mg/dL   eGFR 79 >09 UE/AVW/0.98   BUN/Creatinine Ratio 31 (H) 12 - 28   Sodium 140 134 - 144 mmol/L   Potassium 5.4 (H) 3.5 - 5.2 mmol/L   Chloride 102 96 - 106 mmol/L   CO2 25 20 - 29 mmol/L   Calcium 9.9 8.7 - 10.3 mg/dL   Total Protein 7.1 6.0 - 8.5 g/dL   Albumin 4.5 3.8 - 4.8 g/dL   Globulin, Total 2.6 1.5 - 4.5 g/dL   Bilirubin Total 0.7 0.0 - 1.2 mg/dL  Alkaline Phosphatase 84 44 - 121 IU/L   AST 21 0 - 40 IU/L   ALT 12 0 - 32 IU/L   Lipid panel     Status: Abnormal   Collection Time: 05/21/23  8:50 AM  Result Value Ref Range   Cholesterol, Total 232 (H) 100 - 199 mg/dL   Triglycerides 952 (H) 0 - 149 mg/dL   HDL 63 >84 mg/dL   VLDL Cholesterol Cal 35 5 - 40 mg/dL   LDL Chol Calc (NIH) 132 (H) 0 - 99 mg/dL   Chol/HDL Ratio 3.7 0.0 - 4.4 ratio    Comment:                                   T. Chol/HDL Ratio                                             Men  Women                               1/2 Avg.Risk  3.4    3.3                                   Avg.Risk  5.0    4.4                                2X Avg.Risk  9.6    7.1                                3X Avg.Risk 23.4   11.0   CBC with Differential/Platelet     Status: None   Collection Time: 05/21/23  8:50 AM  Result Value Ref Range   WBC 4.9 3.4 - 10.8 x10E3/uL   RBC 4.85 3.77 - 5.28 x10E6/uL   Hemoglobin 14.7 11.1 - 15.9 g/dL   Hematocrit 44.0 10.2 - 46.6 %   MCV 93 79 - 97 fL   MCH 30.3 26.6 - 33.0 pg   MCHC 32.6 31.5 - 35.7 g/dL   RDW 72.5 36.6 - 44.0 %   Platelets 273 150 - 450 x10E3/uL   Neutrophils 53 Not Estab. %   Lymphs 35 Not Estab. %   Monocytes 9 Not Estab. %   Eos 2 Not Estab. %   Basos 1 Not Estab. %   Neutrophils Absolute 2.6 1.4 - 7.0 x10E3/uL   Lymphocytes Absolute 1.7 0.7 - 3.1 x10E3/uL   Monocytes Absolute 0.4 0.1 - 0.9 x10E3/uL   EOS (ABSOLUTE) 0.1 0.0 - 0.4 x10E3/uL   Basophils Absolute 0.1 0.0 - 0.2 x10E3/uL   Immature Granulocytes 0 Not Estab. %   Immature Grans (Abs) 0.0 0.0 - 0.1 x10E3/uL      Assessment & Plan:  Increase levothyroxine. Recheck potassium today. Continue strict diabetic diet.  Problem List Items Addressed This Visit       Cardiovascular and Mediastinum   Benign essential hypertension - Primary (Chronic)     Endocrine   Type 2 diabetes mellitus with hyperglycemia, without long-term current use of insulin (  HCC)   Primary hypothyroidism   Relevant Medications   levothyroxine (SYNTHROID) 200 MCG  tablet     Other   Mixed hyperlipidemia   Other Visit Diagnoses       Hyperkalemia       Relevant Orders   Potassium       Return in about 4 months (around 09/25/2023) for with fasting labs prior.   Total time spent: 25 minutes  Google, NP  05/27/2023   This document may have been prepared by Dragon Voice Recognition software and as such may include unintentional dictation errors.

## 2023-05-28 LAB — POTASSIUM: Potassium: 4.9 mmol/L (ref 3.5–5.2)

## 2023-09-16 ENCOUNTER — Other Ambulatory Visit

## 2023-09-16 DIAGNOSIS — E1165 Type 2 diabetes mellitus with hyperglycemia: Secondary | ICD-10-CM

## 2023-09-16 DIAGNOSIS — E782 Mixed hyperlipidemia: Secondary | ICD-10-CM

## 2023-09-16 DIAGNOSIS — E039 Hypothyroidism, unspecified: Secondary | ICD-10-CM | POA: Diagnosis not present

## 2023-09-16 DIAGNOSIS — Z1329 Encounter for screening for other suspected endocrine disorder: Secondary | ICD-10-CM

## 2023-09-16 DIAGNOSIS — I10 Essential (primary) hypertension: Secondary | ICD-10-CM | POA: Diagnosis not present

## 2023-09-17 LAB — CBC WITH DIFF/PLATELET
Basophils Absolute: 0 10*3/uL (ref 0.0–0.2)
Basos: 1 %
EOS (ABSOLUTE): 0.1 10*3/uL (ref 0.0–0.4)
Eos: 3 %
Hematocrit: 45.1 % (ref 34.0–46.6)
Hemoglobin: 14.6 g/dL (ref 11.1–15.9)
Immature Grans (Abs): 0 10*3/uL (ref 0.0–0.1)
Immature Granulocytes: 0 %
Lymphocytes Absolute: 2.1 10*3/uL (ref 0.7–3.1)
Lymphs: 40 %
MCH: 29.6 pg (ref 26.6–33.0)
MCHC: 32.4 g/dL (ref 31.5–35.7)
MCV: 91 fL (ref 79–97)
Monocytes Absolute: 0.6 10*3/uL (ref 0.1–0.9)
Monocytes: 10 %
Neutrophils Absolute: 2.5 10*3/uL (ref 1.4–7.0)
Neutrophils: 46 %
Platelets: 316 10*3/uL (ref 150–450)
RBC: 4.94 x10E6/uL (ref 3.77–5.28)
RDW: 12.5 % (ref 11.7–15.4)
WBC: 5.3 10*3/uL (ref 3.4–10.8)

## 2023-09-17 LAB — CMP14+EGFR
ALT: 25 IU/L (ref 0–32)
AST: 23 IU/L (ref 0–40)
Albumin: 4.1 g/dL (ref 3.8–4.8)
Alkaline Phosphatase: 121 IU/L (ref 44–121)
BUN/Creatinine Ratio: 37 — ABNORMAL HIGH (ref 12–28)
BUN: 25 mg/dL (ref 8–27)
Bilirubin Total: 0.4 mg/dL (ref 0.0–1.2)
CO2: 24 mmol/L (ref 20–29)
Calcium: 9.8 mg/dL (ref 8.7–10.3)
Chloride: 102 mmol/L (ref 96–106)
Creatinine, Ser: 0.68 mg/dL (ref 0.57–1.00)
Globulin, Total: 2.7 g/dL (ref 1.5–4.5)
Glucose: 143 mg/dL — ABNORMAL HIGH (ref 70–99)
Potassium: 5 mmol/L (ref 3.5–5.2)
Sodium: 141 mmol/L (ref 134–144)
Total Protein: 6.8 g/dL (ref 6.0–8.5)
eGFR: 93 mL/min/{1.73_m2} (ref >59)

## 2023-09-17 LAB — TSH: TSH: 0.282 u[IU]/mL — ABNORMAL LOW (ref 0.450–4.500)

## 2023-09-17 LAB — LIPID PANEL
Chol/HDL Ratio: 4.9 ratio — ABNORMAL HIGH (ref 0.0–4.4)
Cholesterol, Total: 242 mg/dL — ABNORMAL HIGH (ref 100–199)
HDL: 49 mg/dL (ref >39)
LDL Chol Calc (NIH): 151 mg/dL — ABNORMAL HIGH (ref 0–99)
Triglycerides: 229 mg/dL — ABNORMAL HIGH (ref 0–149)
VLDL Cholesterol Cal: 42 mg/dL — ABNORMAL HIGH (ref 5–40)

## 2023-09-17 LAB — HEMOGLOBIN A1C
Est. average glucose Bld gHb Est-mCnc: 174 mg/dL
Hgb A1c MFr Bld: 7.7 % — ABNORMAL HIGH (ref 4.8–5.6)

## 2023-09-23 ENCOUNTER — Encounter: Payer: Self-pay | Admitting: Cardiology

## 2023-09-23 ENCOUNTER — Ambulatory Visit (INDEPENDENT_AMBULATORY_CARE_PROVIDER_SITE_OTHER): Payer: Medicare HMO | Admitting: Cardiology

## 2023-09-23 VITALS — BP 140/72 | HR 76 | Ht 60.0 in | Wt 165.0 lb

## 2023-09-23 DIAGNOSIS — I10 Essential (primary) hypertension: Secondary | ICD-10-CM

## 2023-09-23 DIAGNOSIS — E1165 Type 2 diabetes mellitus with hyperglycemia: Secondary | ICD-10-CM | POA: Diagnosis not present

## 2023-09-23 DIAGNOSIS — E782 Mixed hyperlipidemia: Secondary | ICD-10-CM

## 2023-09-23 DIAGNOSIS — E039 Hypothyroidism, unspecified: Secondary | ICD-10-CM

## 2023-09-23 DIAGNOSIS — Z1211 Encounter for screening for malignant neoplasm of colon: Secondary | ICD-10-CM | POA: Diagnosis not present

## 2023-09-23 LAB — POCT UA - MICROALBUMIN
Albumin/Creatinine Ratio, Urine, POC: <30
Creatinine, POC: 50 mg/dL
Microalbumin Ur, POC: 10 mg/L

## 2023-09-23 MED ORDER — FENOFIBRATE 145 MG PO TABS: 145.0000 mg | ORAL_TABLET | Freq: Every day | ORAL | 1 refills | Status: DC

## 2023-09-23 MED ORDER — LEVOTHYROXINE SODIUM 150 MCG PO TABS
150.0000 ug | ORAL_TABLET | Freq: Every day | ORAL | 1 refills | Status: DC
Start: 2023-09-23 — End: 2023-12-31

## 2023-09-23 NOTE — Patient Instructions (Signed)
 [facility] [address], [address] [place],  [place]  [address]  Main: [phone]

## 2023-09-23 NOTE — Progress Notes (Signed)
 Established Patient Office Visit  Subjective:  Patient ID: Kathryn Poole, female    DOB: April 11, 1951  Age: 73 y.o. MRN: [id number]  Chief Complaint  Patient presents with   Follow-up    4 Months Labs Results    Patient in office for 4 month follow up, discuss recent lab results. Son acts as an Engineer, technical sales. Patient doing well, no complaints today.  Discussed recent lab work. Hgb A1c improved. TSH abnormal, will decrease levothyroxine  to 150 mcg. LDL and triglycerides elevated. Patient confirms she is taking her medications. Tricor  not refilled in many years. Will send in refill.  Call to schedule mammogram.  Cologuard 01/2023 positive, referral placed to GI at that time. Patient did not follow up with GI. Will resend referral.      No other concerns at this time.   Past Medical History:  Diagnosis Date   Diabetes (HCC)    Thyroid  disease     History reviewed. No pertinent surgical history.  Social History   Socioeconomic History   Marital status: Single    Spouse name: Not on file   Number of children: Not on file   Years of education: Not on file   Highest education level: Not on file  Occupational History   Not on file  Tobacco Use   Smoking status: Never   Smokeless tobacco: Never  Substance and Sexual Activity   Alcohol use: No    Alcohol/week: 0.0 standard drinks of alcohol   Drug use: Not on file   Sexual activity: Not on file  Other Topics Concern   Not on file  Social History Narrative   Not on file   Social Drivers of Health   Financial Resource Strain: Not on file  Food Insecurity: Not on file  Transportation Needs: Not on file  Physical Activity: Not on file  Stress: Not on file  Social Connections: Not on file  Intimate Partner Violence: Not on file    Family History  Problem Relation Age of Onset   Breast cancer Neg Hx     No Known Allergies  Outpatient Medications Prior to Visit  Medication Sig   albuterol  (VENTOLIN  HFA) 108 (90 Base)  MCG/ACT inhaler INHALE 2 PUFFS INTO THE LUNGS EVERY 4 HOURS AS NEEDED   cyclobenzaprine (FLEXERIL) 5 MG tablet Take 5 mg by mouth 3 (three) times daily.   diclofenac (VOLTAREN) 75 MG EC tablet Take 75 mg by mouth 2 (two) times daily.   INVOKANA  300 MG TABS tablet Take 1 tablet (300 mg total) by mouth daily.   losartan (COZAAR) 100 MG tablet Take 100 mg by mouth daily.   metFORMIN  (GLUCOPHAGE ) 1000 MG tablet Take 1 tablet (1,000 mg total) by mouth 2 (two) times daily.   montelukast  (SINGULAIR ) 10 MG tablet Take 1 tablet (10 mg total) by mouth at bedtime.   pioglitazone  (ACTOS ) 30 MG tablet Take 1 tablet (30 mg total) by mouth daily.   rosuvastatin  (CRESTOR ) 40 MG tablet Take 1 tablet (40 mg total) by mouth daily.   [DISCONTINUED] fenofibrate  (TRICOR ) 145 MG tablet Take 145 mg by mouth daily.   [DISCONTINUED] levothyroxine  (SYNTHROID ) 200 MCG tablet Take 1 tablet (200 mcg total) by mouth daily.   No facility-administered medications prior to visit.    Review of Systems  Constitutional: Negative.   HENT: Negative.    Eyes: Negative.   Respiratory: Negative.  Negative for shortness of breath.   Cardiovascular: Negative.  Negative for chest pain.  Gastrointestinal: Negative.  Negative  for abdominal pain, constipation and diarrhea.  Genitourinary: Negative.   Musculoskeletal:  Negative for joint pain and myalgias.  Skin: Negative.   Neurological: Negative.  Negative for dizziness and headaches.  Endo/Heme/Allergies: Negative.   All other systems reviewed and are negative.      Objective:   BP (!) 140/72   Pulse 76   Ht 5' (1.524 m)   Wt 165 lb (74.8 kg)   SpO2 96%   BMI 32.22 kg/m   Vitals:   09/23/23 0902  BP: (!) 140/72  Pulse: 76  Height: 5' (1.524 m)  Weight: 165 lb (74.8 kg)  SpO2: 96%  BMI (Calculated): 32.22    Physical Exam Vitals and nursing note reviewed.  Constitutional:      Appearance: Normal appearance. She is normal weight.  HENT:     Head:  Normocephalic and atraumatic.     Nose: Nose normal.     Mouth/Throat:     Mouth: Mucous membranes are moist.  Eyes:     Extraocular Movements: Extraocular movements intact.     Conjunctiva/sclera: Conjunctivae normal.     Pupils: Pupils are equal, round, and reactive to light.  Cardiovascular:     Rate and Rhythm: Normal rate and regular rhythm.     Pulses: Normal pulses.     Heart sounds: Normal heart sounds.  Pulmonary:     Effort: Pulmonary effort is normal.     Breath sounds: Normal breath sounds.  Abdominal:     General: Abdomen is flat. Bowel sounds are normal.     Palpations: Abdomen is soft.  Musculoskeletal:        General: Normal range of motion.     Cervical back: Normal range of motion.  Skin:    General: Skin is warm and dry.  Neurological:     General: No focal deficit present.     Mental Status: She is alert and oriented to person, place, and time.  Psychiatric:        Mood and Affect: Mood normal.        Behavior: Behavior normal.        Thought Content: Thought content normal.        Judgment: Judgment normal.      Results for orders placed or performed in visit on 09/23/23  POCT Urine Albumin/Creatinine with ratio [[id number]]  Result Value Ref Range   Microalbumin Ur, POC 10 mg/L   Creatinine, POC 50 mg/dL   Albumin/Creatinine Ratio, Urine, POC <30     Recent Results (from the past 2160 hours)  Hemoglobin A1c     Status: Abnormal   Collection Time: 09/16/23  8:17 AM  Result Value Ref Range   Hgb A1c MFr Bld 7.7 (H) 4.8 - 5.6 %    Comment:          Prediabetes: 5.7 - 6.4          Diabetes: >6.4          Glycemic control for adults with diabetes: <7.0    Est. average glucose Bld gHb Est-mCnc 174 mg/dL  TSH     Status: Abnormal   Collection Time: 09/16/23  8:17 AM  Result Value Ref Range   TSH 0.282 (L) 0.450 - 4.500 uIU/mL  CMP14+EGFR     Status: Abnormal   Collection Time: 09/16/23  8:17 AM  Result Value Ref Range   Glucose 143 (H) 70 - 99  mg/dL   BUN 25 8 - 27 mg/dL   Creatinine, Ser  0.68 0.57 - 1.00 mg/dL   eGFR 93 >95 MW/UXL/2.44   BUN/Creatinine Ratio 37 (H) 12 - 28   Sodium 141 134 - 144 mmol/L   Potassium 5.0 3.5 - 5.2 mmol/L   Chloride 102 96 - 106 mmol/L   CO2 24 20 - 29 mmol/L   Calcium  9.8 8.7 - 10.3 mg/dL   Total Protein 6.8 6.0 - 8.5 g/dL   Albumin 4.1 3.8 - 4.8 g/dL   Globulin, Total 2.7 1.5 - 4.5 g/dL   Bilirubin Total 0.4 0.0 - 1.2 mg/dL   Alkaline Phosphatase 121 44 - 121 IU/L   AST 23 0 - 40 IU/L   ALT 25 0 - 32 IU/L  Lipid panel     Status: Abnormal   Collection Time: 09/16/23  8:17 AM  Result Value Ref Range   Cholesterol, Total 242 (H) 100 - 199 mg/dL   Triglycerides 010 (H) 0 - 149 mg/dL   HDL 49 >27 mg/dL   VLDL Cholesterol Cal 42 (H) 5 - 40 mg/dL   LDL Chol Calc (NIH) 253 (H) 0 - 99 mg/dL   Chol/HDL Ratio 4.9 (H) 0.0 - 4.4 ratio    Comment:                                   T. Chol/HDL Ratio                                             Men  Women                               1/2 Avg.Risk  3.4    3.3                                   Avg.Risk  5.0    4.4                                2X Avg.Risk  9.6    7.1                                3X Avg.Risk 23.4   11.0   CBC With Diff/Platelet     Status: None   Collection Time: 09/16/23  8:17 AM  Result Value Ref Range   WBC 5.3 3.4 - 10.8 x10E3/uL   RBC 4.94 3.77 - 5.28 x10E6/uL   Hemoglobin 14.6 11.1 - 15.9 g/dL   Hematocrit 66.4 40.3 - 46.6 %   MCV 91 79 - 97 fL   MCH 29.6 26.6 - 33.0 pg   MCHC 32.4 31.5 - 35.7 g/dL   RDW 47.4 25.9 - 56.3 %   Platelets 316 150 - 450 x10E3/uL   Neutrophils 46 Not Estab. %   Lymphs 40 Not Estab. %   Monocytes 10 Not Estab. %   Eos 3 Not Estab. %   Basos 1 Not Estab. %   Neutrophils Absolute 2.5 1.4 - 7.0 x10E3/uL   Lymphocytes Absolute 2.1 0.7 - 3.1 x10E3/uL   Monocytes Absolute 0.6 0.1 - 0.9  x10E3/uL   EOS (ABSOLUTE) 0.1 0.0 - 0.4 x10E3/uL   Basophils Absolute 0.0 0.0 - 0.2 x10E3/uL   Immature  Granulocytes 0 Not Estab. %   Immature Grans (Abs) 0.0 0.0 - 0.1 x10E3/uL  POCT Urine Albumin/Creatinine with ratio [[id number]]     Status: Normal   Collection Time: 09/23/23  9:31 AM  Result Value Ref Range   Microalbumin Ur, POC 10 mg/L   Creatinine, POC 50 mg/dL   Albumin/Creatinine Ratio, Urine, POC <30       Assessment & Plan:  Decrease levothyroxine  to 150 mcg Schedule mammogram Referral resent to GI.   Problem List Items Addressed This Visit       Cardiovascular and Mediastinum   Benign essential hypertension - Primary (Chronic)   Relevant Medications   fenofibrate  (TRICOR ) 145 MG tablet     Endocrine   Type 2 diabetes mellitus with hyperglycemia, without long-term current use of insulin (HCC)   Relevant Orders   POCT Urine Albumin/Creatinine with ratio [[id number]] (Completed)   Primary hypothyroidism   Relevant Medications   levothyroxine  (SYNTHROID ) 150 MCG tablet     Other   Mixed hyperlipidemia   Relevant Medications   fenofibrate  (TRICOR ) 145 MG tablet   Other Visit Diagnoses       Colon cancer screening       Relevant Orders   Ambulatory referral to Gastroenterology       Return in about 4 months (around 01/23/2024) for with fasting lab work prior.   Total time spent: 25 minutes  Google, NP  09/23/2023   This document may have been prepared by Dragon Voice Recognition software and as such may include unintentional dictation errors.

## 2023-10-23 ENCOUNTER — Other Ambulatory Visit: Payer: Self-pay

## 2023-10-23 ENCOUNTER — Other Ambulatory Visit: Payer: Self-pay | Admitting: Internal Medicine

## 2023-10-24 MED ORDER — LOSARTAN POTASSIUM 100 MG PO TABS: 100.0000 mg | ORAL_TABLET | Freq: Every day | ORAL | 1 refills | Status: DC

## 2023-11-18 ENCOUNTER — Other Ambulatory Visit: Payer: Self-pay

## 2023-11-18 MED ORDER — INVOKANA 300 MG PO TABS: 300.0000 mg | ORAL_TABLET | Freq: Every day | ORAL | 1 refills | Status: DC

## 2023-12-31 ENCOUNTER — Other Ambulatory Visit: Payer: Self-pay | Admitting: Cardiology

## 2024-01-03 ENCOUNTER — Other Ambulatory Visit: Payer: Self-pay | Admitting: Cardiology

## 2024-01-07 ENCOUNTER — Other Ambulatory Visit: Payer: Self-pay | Admitting: Cardiology

## 2024-01-20 ENCOUNTER — Other Ambulatory Visit

## 2024-01-20 DIAGNOSIS — I10 Essential (primary) hypertension: Secondary | ICD-10-CM

## 2024-01-20 DIAGNOSIS — E039 Hypothyroidism, unspecified: Secondary | ICD-10-CM | POA: Diagnosis not present

## 2024-01-20 DIAGNOSIS — E1165 Type 2 diabetes mellitus with hyperglycemia: Secondary | ICD-10-CM | POA: Diagnosis not present

## 2024-01-20 DIAGNOSIS — Z1329 Encounter for screening for other suspected endocrine disorder: Secondary | ICD-10-CM

## 2024-01-20 DIAGNOSIS — E782 Mixed hyperlipidemia: Secondary | ICD-10-CM | POA: Diagnosis not present

## 2024-01-21 ENCOUNTER — Ambulatory Visit: Payer: Self-pay | Admitting: Cardiology

## 2024-01-21 LAB — CBC WITH DIFF/PLATELET
Basophils Absolute: 0.1 x10E3/uL (ref 0.0–0.2)
Basos: 1 %
EOS (ABSOLUTE): 0.1 x10E3/uL (ref 0.0–0.4)
Eos: 1 %
Hematocrit: 46.5 % (ref 34.0–46.6)
Hemoglobin: 14.6 g/dL (ref 11.1–15.9)
Immature Grans (Abs): 0 x10E3/uL (ref 0.0–0.1)
Immature Granulocytes: 0 %
Lymphocytes Absolute: 2.1 x10E3/uL (ref 0.7–3.1)
Lymphs: 24 %
MCH: 29.1 pg (ref 26.6–33.0)
MCHC: 31.4 g/dL — ABNORMAL LOW (ref 31.5–35.7)
MCV: 93 fL (ref 79–97)
Monocytes Absolute: 0.7 x10E3/uL (ref 0.1–0.9)
Monocytes: 8 %
Neutrophils Absolute: 5.7 x10E3/uL (ref 1.4–7.0)
Neutrophils: 66 %
Platelets: 327 x10E3/uL (ref 150–450)
RBC: 5.01 x10E6/uL (ref 3.77–5.28)
RDW: 13.4 % (ref 11.7–15.4)
WBC: 8.6 x10E3/uL (ref 3.4–10.8)

## 2024-01-21 LAB — LIPID PANEL
Chol/HDL Ratio: 4 ratio (ref 0.0–4.4)
Cholesterol, Total: 237 mg/dL — ABNORMAL HIGH (ref 100–199)
HDL: 60 mg/dL (ref >39)
LDL Chol Calc (NIH): 150 mg/dL — ABNORMAL HIGH (ref 0–99)
Triglycerides: 151 mg/dL — ABNORMAL HIGH (ref 0–149)
VLDL Cholesterol Cal: 27 mg/dL (ref 5–40)

## 2024-01-21 LAB — CMP14+EGFR
ALT: 11 IU/L (ref 0–32)
AST: 17 IU/L (ref 0–40)
Albumin: 4.3 g/dL (ref 3.8–4.8)
Alkaline Phosphatase: 95 IU/L (ref 44–121)
BUN/Creatinine Ratio: 34 — ABNORMAL HIGH (ref 12–28)
BUN: 25 mg/dL (ref 8–27)
Bilirubin Total: 0.5 mg/dL (ref 0.0–1.2)
CO2: 21 mmol/L (ref 20–29)
Calcium: 9.8 mg/dL (ref 8.7–10.3)
Chloride: 101 mmol/L (ref 96–106)
Creatinine, Ser: 0.73 mg/dL (ref 0.57–1.00)
Globulin, Total: 2.7 g/dL (ref 1.5–4.5)
Glucose: 144 mg/dL — ABNORMAL HIGH (ref 70–99)
Potassium: 5 mmol/L (ref 3.5–5.2)
Sodium: 138 mmol/L (ref 134–144)
Total Protein: 7 g/dL (ref 6.0–8.5)
eGFR: 87 mL/min/1.73 (ref >59)

## 2024-01-21 LAB — TSH: TSH: 1.41 u[IU]/mL (ref 0.450–4.500)

## 2024-01-21 LAB — HEMOGLOBIN A1C
Est. average glucose Bld gHb Est-mCnc: 180 mg/dL
Hgb A1c MFr Bld: 7.9 % — ABNORMAL HIGH (ref 4.8–5.6)

## 2024-01-23 ENCOUNTER — Ambulatory Visit (INDEPENDENT_AMBULATORY_CARE_PROVIDER_SITE_OTHER): Admitting: Cardiology

## 2024-01-23 ENCOUNTER — Encounter: Payer: Self-pay | Admitting: Cardiology

## 2024-01-23 VITALS — BP 170/78 | HR 88 | Ht 60.0 in | Wt 166.0 lb

## 2024-01-23 DIAGNOSIS — I10 Essential (primary) hypertension: Secondary | ICD-10-CM | POA: Diagnosis not present

## 2024-01-23 DIAGNOSIS — G43009 Migraine without aura, not intractable, without status migrainosus: Secondary | ICD-10-CM | POA: Diagnosis not present

## 2024-01-23 DIAGNOSIS — E1165 Type 2 diabetes mellitus with hyperglycemia: Secondary | ICD-10-CM

## 2024-01-23 MED ORDER — TOPIRAMATE 25 MG PO TABS: 25.0000 mg | ORAL_TABLET | Freq: Every day | ORAL | 2 refills | Status: DC

## 2024-01-23 MED ORDER — SUMATRIPTAN SUCCINATE 25 MG PO TABS: 25.0000 mg | ORAL_TABLET | ORAL | 0 refills | Status: AC | PRN

## 2024-01-23 NOTE — Progress Notes (Signed)
 Established Patient Office Visit  Subjective:  Patient ID: Kathryn Poole, female    DOB: Apr 17, 1951  Age: 73 y.o. MRN: [id number]  Chief Complaint  Patient presents with   Acute Visit    Lside Headache and L Eye blurry    Patient in office for an acute visit, complaining of a left sided headache and left eye blurry vision. 1-2 time a month. Denies nausea or vomiting when the headache occurs. Light and noise make the headache worse. Will send in Topamax  for daily medication. Will send in Imitrex  for abortive medication.   Headache  This is a new problem. The current episode started 1 to 4 weeks ago. The problem occurs intermittently. The problem has been waxing and waning. The pain is located in the Left unilateral region. The pain does not radiate. The quality of the pain is described as aching. Associated symptoms include blurred vision, phonophobia and photophobia. Pertinent negatives include no abdominal pain, dizziness, nausea or vomiting. The symptoms are aggravated by noise and bright light. She has tried NSAIDs and acetaminophen for the symptoms. The treatment provided mild relief. Her past medical history is significant for hypertension and obesity.    No other concerns at this time.   Past Medical History:  Diagnosis Date   Diabetes (HCC)    Thyroid  disease     History reviewed. No pertinent surgical history.  Social History   Socioeconomic History   Marital status: Single    Spouse name: Not on file   Number of children: Not on file   Years of education: Not on file   Highest education level: Not on file  Occupational History   Not on file  Tobacco Use   Smoking status: Never   Smokeless tobacco: Never  Substance and Sexual Activity   Alcohol use: No    Alcohol/week: 0.0 standard drinks of alcohol   Drug use: Not on file   Sexual activity: Not on file  Other Topics Concern   Not on file  Social History Narrative   Not on file   Social Drivers of Health    Financial Resource Strain: Not on file  Food Insecurity: Not on file  Transportation Needs: Not on file  Physical Activity: Not on file  Stress: Not on file  Social Connections: Not on file  Intimate Partner Violence: Not on file    Family History  Problem Relation Age of Onset   Breast cancer Neg Hx     No Known Allergies  Outpatient Medications Prior to Visit  Medication Sig   albuterol  (VENTOLIN  HFA) 108 (90 Base) MCG/ACT inhaler INHALE 2 PUFFS INTO THE LUNGS EVERY 4 HOURS AS NEEDED   cyclobenzaprine (FLEXERIL) 5 MG tablet Take 5 mg by mouth 3 (three) times daily.   diclofenac (VOLTAREN) 75 MG EC tablet Take 75 mg by mouth 2 (two) times daily.   fenofibrate  (TRICOR ) 145 MG tablet Take 1 tablet (145 mg total) by mouth daily.   INVOKANA  300 MG TABS tablet Take 1 tablet (300 mg total) by mouth daily.   levothyroxine  (SYNTHROID ) 150 MCG tablet TAKE 1 TABLET(150 MCG) BY MOUTH DAILY   losartan  (COZAAR ) 100 MG tablet Take 1 tablet (100 mg total) by mouth daily.   metFORMIN  (GLUCOPHAGE ) 1000 MG tablet Take 1 tablet (1,000 mg total) by mouth 2 (two) times daily.   montelukast  (SINGULAIR ) 10 MG tablet Take 1 tablet (10 mg total) by mouth at bedtime.   pioglitazone  (ACTOS ) 30 MG tablet TAKE 1 TABLET(30 MG)  BY MOUTH DAILY   rosuvastatin  (CRESTOR ) 40 MG tablet TAKE 1 TABLET(40 MG) BY MOUTH DAILY   No facility-administered medications prior to visit.    Review of Systems  Constitutional: Negative.   HENT: Negative.    Eyes:  Positive for blurred vision and photophobia.  Respiratory: Negative.  Negative for shortness of breath.   Cardiovascular: Negative.  Negative for chest pain.  Gastrointestinal: Negative.  Negative for abdominal pain, constipation, diarrhea, nausea and vomiting.  Genitourinary: Negative.   Musculoskeletal:  Negative for joint pain and myalgias.  Skin: Negative.   Neurological:  Positive for headaches. Negative for dizziness.  Endo/Heme/Allergies: Negative.    All other systems reviewed and are negative.      Objective:   BP (!) 170/78   Pulse 88   Ht 5' (1.524 m)   Wt 166 lb (75.3 kg)   SpO2 98%   BMI 32.42 kg/m   Vitals:   01/23/24 1347  BP: (!) 170/78  Pulse: 88  Height: 5' (1.524 m)  Weight: 166 lb (75.3 kg)  SpO2: 98%  BMI (Calculated): 32.42    Physical Exam Vitals and nursing note reviewed.  Constitutional:      Appearance: Normal appearance. She is normal weight.  HENT:     Head: Normocephalic and atraumatic.     Nose: Nose normal.     Mouth/Throat:     Mouth: Mucous membranes are moist.  Eyes:     Extraocular Movements: Extraocular movements intact.     Conjunctiva/sclera: Conjunctivae normal.     Pupils: Pupils are equal, round, and reactive to light.  Cardiovascular:     Rate and Rhythm: Normal rate and regular rhythm.     Pulses: Normal pulses.     Heart sounds: Normal heart sounds.  Pulmonary:     Effort: Pulmonary effort is normal.     Breath sounds: Normal breath sounds.  Abdominal:     General: Abdomen is flat. Bowel sounds are normal.     Palpations: Abdomen is soft.  Musculoskeletal:        General: Normal range of motion.     Cervical back: Normal range of motion.  Skin:    General: Skin is warm and dry.  Neurological:     General: No focal deficit present.     Mental Status: She is alert and oriented to person, place, and time.  Psychiatric:        Mood and Affect: Mood normal.        Behavior: Behavior normal.        Thought Content: Thought content normal.        Judgment: Judgment normal.      No results found for any visits on 01/23/24.  Recent Results (from the past 2160 hours)  CBC With Diff/Platelet     Status: Abnormal   Collection Time: 01/20/24  8:37 AM  Result Value Ref Range   WBC 8.6 3.4 - 10.8 x10E3/uL   RBC 5.01 3.77 - 5.28 x10E6/uL   Hemoglobin 14.6 11.1 - 15.9 g/dL   Hematocrit 53.4 65.9 - 46.6 %   MCV 93 79 - 97 fL   MCH 29.1 26.6 - 33.0 pg   MCHC 31.4 (L)  31.5 - 35.7 g/dL   RDW 86.5 88.2 - 84.5 %   Platelets 327 150 - 450 x10E3/uL   Neutrophils 66 Not Estab. %   Lymphs 24 Not Estab. %   Monocytes 8 Not Estab. %   Eos 1 Not Estab. %  Basos 1 Not Estab. %   Neutrophils Absolute 5.7 1.4 - 7.0 x10E3/uL   Lymphocytes Absolute 2.1 0.7 - 3.1 x10E3/uL   Monocytes Absolute 0.7 0.1 - 0.9 x10E3/uL   EOS (ABSOLUTE) 0.1 0.0 - 0.4 x10E3/uL   Basophils Absolute 0.1 0.0 - 0.2 x10E3/uL   Immature Granulocytes 0 Not Estab. %   Immature Grans (Abs) 0.0 0.0 - 0.1 x10E3/uL  Lipid panel     Status: Abnormal   Collection Time: 01/20/24  8:37 AM  Result Value Ref Range   Cholesterol, Total 237 (H) 100 - 199 mg/dL   Triglycerides 848 (H) 0 - 149 mg/dL   HDL 60 >60 mg/dL   VLDL Cholesterol Cal 27 5 - 40 mg/dL   LDL Chol Calc (NIH) 849 (H) 0 - 99 mg/dL   Chol/HDL Ratio 4.0 0.0 - 4.4 ratio    Comment:                                   T. Chol/HDL Ratio                                             Men  Women                               1/2 Avg.Risk  3.4    3.3                                   Avg.Risk  5.0    4.4                                2X Avg.Risk  9.6    7.1                                3X Avg.Risk 23.4   11.0   CMP14+EGFR     Status: Abnormal   Collection Time: 01/20/24  8:37 AM  Result Value Ref Range   Glucose 144 (H) 70 - 99 mg/dL   BUN 25 8 - 27 mg/dL   Creatinine, Ser 9.26 0.57 - 1.00 mg/dL   eGFR 87 >40 fO/fpw/8.26   BUN/Creatinine Ratio 34 (H) 12 - 28   Sodium 138 134 - 144 mmol/L   Potassium 5.0 3.5 - 5.2 mmol/L   Chloride 101 96 - 106 mmol/L   CO2 21 20 - 29 mmol/L   Calcium  9.8 8.7 - 10.3 mg/dL   Total Protein 7.0 6.0 - 8.5 g/dL   Albumin 4.3 3.8 - 4.8 g/dL   Globulin, Total 2.7 1.5 - 4.5 g/dL   Bilirubin Total 0.5 0.0 - 1.2 mg/dL   Alkaline Phosphatase 95 44 - 121 IU/L   AST 17 0 - 40 IU/L   ALT 11 0 - 32 IU/L  TSH     Status: None   Collection Time: 01/20/24  8:37 AM  Result Value Ref Range   TSH 1.410 0.450 - 4.500  uIU/mL  Hemoglobin A1c     Status: Abnormal   Collection Time: 01/20/24  8:37 AM  Result Value Ref Range   Hgb A1c MFr Bld 7.9 (H) 4.8 - 5.6 %    Comment:          Prediabetes: 5.7 - 6.4          Diabetes: >6.4          Glycemic control for adults with diabetes: <7.0    Est. average glucose Bld gHb Est-mCnc 180 mg/dL      Assessment & Plan:  Topamax  Imitrex   Problem List Items Addressed This Visit       Cardiovascular and Mediastinum   Migraine without aura and without status migrainosus, not intractable - Primary   Relevant Medications   topiramate  (TOPAMAX ) 25 MG tablet   SUMAtriptan  (IMITREX ) 25 MG tablet    Return if symptoms worsen or fail to improve, for as scheduled.   Total time spent: 25 minutes  Google, NP  01/23/2024   This document may have been prepared by Dragon Voice Recognition software and as such may include unintentional dictation errors.

## 2024-01-27 ENCOUNTER — Ambulatory Visit (INDEPENDENT_AMBULATORY_CARE_PROVIDER_SITE_OTHER): Admitting: Cardiology

## 2024-01-27 ENCOUNTER — Encounter: Payer: Self-pay | Admitting: Cardiology

## 2024-01-27 VITALS — BP 148/78 | HR 75 | Ht 60.0 in | Wt 165.0 lb

## 2024-01-27 DIAGNOSIS — E1165 Type 2 diabetes mellitus with hyperglycemia: Secondary | ICD-10-CM | POA: Diagnosis not present

## 2024-01-27 DIAGNOSIS — E782 Mixed hyperlipidemia: Secondary | ICD-10-CM | POA: Diagnosis not present

## 2024-01-27 DIAGNOSIS — I10 Essential (primary) hypertension: Secondary | ICD-10-CM | POA: Diagnosis not present

## 2024-01-27 DIAGNOSIS — E039 Hypothyroidism, unspecified: Secondary | ICD-10-CM | POA: Diagnosis not present

## 2024-01-27 MED ORDER — ALBUTEROL SULFATE HFA 108 (90 BASE) MCG/ACT IN AERS: 2.0000 | INHALATION_SPRAY | Freq: Four times a day (QID) | RESPIRATORY_TRACT | 3 refills | Status: AC | PRN

## 2024-01-27 MED ORDER — EZETIMIBE 10 MG PO TABS
10.0000 mg | ORAL_TABLET | Freq: Every day | ORAL | 11 refills | Status: AC
Start: 2024-01-27 — End: 2025-01-26

## 2024-01-27 NOTE — Progress Notes (Signed)
 Established Patient Office Visit  Subjective:  Patient ID: Kathryn Poole, female    DOB: [birth year]  Age: 73 y.o. MRN: [id number]  Chief Complaint  Patient presents with   Follow-up    4 months follow up lab results    Patient in office for 4 month follow up, discuss recent lab results. Son acts as an Engineer, technical sales. Patient doing well, no complaints today. Seen on 01/23/24 for migraine, started on Topamax , patient reports headaches improved.  Discussed recent lab work. Hgb A1c stable. TSH normal. LDL and triglycerides elevated. Patient confirms she is taking her medications. Restarted on Tricor  at previous visit. Triglycerides improved. LDL continues to be elevated, will add Zetia .  Call to schedule mammogram.  Cologuard 01/2023 positive, referral placed to GI at that time. Patient did not follow up with GI. Patient refuses colonoscopy.    No other concerns at this time.   Past Medical History:  Diagnosis Date   Diabetes (HCC)    Thyroid  disease     History reviewed. No pertinent surgical history.  Social History   Socioeconomic History   Marital status: Single    Spouse name: Not on file   Number of children: Not on file   Years of education: Not on file   Highest education level: Not on file  Occupational History   Not on file  Tobacco Use   Smoking status: Never   Smokeless tobacco: Never  Substance and Sexual Activity   Alcohol use: No    Alcohol/week: 0.0 standard drinks of alcohol   Drug use: Not on file   Sexual activity: Not on file  Other Topics Concern   Not on file  Social History Narrative   Not on file   Social Drivers of Health   Financial Resource Strain: Not on file  Food Insecurity: Not on file  Transportation Needs: Not on file  Physical Activity: Not on file  Stress: Not on file  Social Connections: Not on file  Intimate Partner Violence: Not on file    Family History  Problem Relation Age of Onset   Breast cancer Neg Hx     No Known  Allergies  Outpatient Medications Prior to Visit  Medication Sig   cyclobenzaprine (FLEXERIL) 5 MG tablet Take 5 mg by mouth 3 (three) times daily.   diclofenac (VOLTAREN) 75 MG EC tablet Take 75 mg by mouth 2 (two) times daily.   fenofibrate  (TRICOR ) 145 MG tablet Take 1 tablet (145 mg total) by mouth daily.   INVOKANA  300 MG TABS tablet Take 1 tablet (300 mg total) by mouth daily.   levothyroxine  (SYNTHROID ) 150 MCG tablet TAKE 1 TABLET(150 MCG) BY MOUTH DAILY   losartan  (COZAAR ) 100 MG tablet Take 1 tablet (100 mg total) by mouth daily.   metFORMIN  (GLUCOPHAGE ) 1000 MG tablet Take 1 tablet (1,000 mg total) by mouth 2 (two) times daily.   montelukast  (SINGULAIR ) 10 MG tablet Take 1 tablet (10 mg total) by mouth at bedtime.   pioglitazone  (ACTOS ) 30 MG tablet TAKE 1 TABLET(30 MG) BY MOUTH DAILY   rosuvastatin  (CRESTOR ) 40 MG tablet TAKE 1 TABLET(40 MG) BY MOUTH DAILY   SUMAtriptan  (IMITREX ) 25 MG tablet Take 1 tablet (25 mg total) by mouth every 2 (two) hours as needed for migraine. May repeat in 2 hours if headache persists or recurs.   topiramate  (TOPAMAX ) 25 MG tablet Take 1 tablet (25 mg total) by mouth at bedtime.   [DISCONTINUED] albuterol  (VENTOLIN  HFA) 108 (90 Base) MCG/ACT inhaler  INHALE 2 PUFFS INTO THE LUNGS EVERY 4 HOURS AS NEEDED   No facility-administered medications prior to visit.    Review of Systems  Constitutional: Negative.   HENT: Negative.    Eyes: Negative.   Respiratory: Negative.  Negative for shortness of breath.   Cardiovascular: Negative.  Negative for chest pain.  Gastrointestinal: Negative.  Negative for abdominal pain, constipation and diarrhea.  Genitourinary: Negative.   Musculoskeletal:  Negative for joint pain and myalgias.  Skin: Negative.   Neurological: Negative.  Negative for dizziness and headaches.  Endo/Heme/Allergies: Negative.   All other systems reviewed and are negative.      Objective:   BP (!) 148/78   Pulse 75   Ht 5' (1.524  m)   Wt 165 lb (74.8 kg)   SpO2 97%   BMI 32.22 kg/m   Vitals:   01/27/24 0903  BP: (!) 148/78  Pulse: 75  Height: 5' (1.524 m)  Weight: 165 lb (74.8 kg)  SpO2: 97%  BMI (Calculated): 32.22    Physical Exam Vitals and nursing note reviewed.  Constitutional:      Appearance: Normal appearance. She is normal weight.  HENT:     Head: Normocephalic and atraumatic.     Nose: Nose normal.     Mouth/Throat:     Mouth: Mucous membranes are moist.  Eyes:     Extraocular Movements: Extraocular movements intact.     Conjunctiva/sclera: Conjunctivae normal.     Pupils: Pupils are equal, round, and reactive to light.  Cardiovascular:     Rate and Rhythm: Normal rate and regular rhythm.     Pulses: Normal pulses.     Heart sounds: Normal heart sounds.  Pulmonary:     Effort: Pulmonary effort is normal.     Breath sounds: Normal breath sounds.  Abdominal:     General: Abdomen is flat. Bowel sounds are normal.     Palpations: Abdomen is soft.  Musculoskeletal:        General: Normal range of motion.     Cervical back: Normal range of motion.  Skin:    General: Skin is warm and dry.  Neurological:     General: No focal deficit present.     Mental Status: She is alert and oriented to person, place, and time.  Psychiatric:        Mood and Affect: Mood normal.        Behavior: Behavior normal.        Thought Content: Thought content normal.        Judgment: Judgment normal.      No results found for any visits on 01/27/24.  Recent Results (from the past 2160 hours)  CBC With Diff/Platelet     Status: Abnormal   Collection Time: 01/20/24  8:37 AM  Result Value Ref Range   WBC 8.6 3.4 - 10.8 x10E3/uL   RBC 5.01 3.77 - 5.28 x10E6/uL   Hemoglobin 14.6 11.1 - 15.9 g/dL   Hematocrit 53.4 65.9 - 46.6 %   MCV 93 79 - 97 fL   MCH 29.1 26.6 - 33.0 pg   MCHC 31.4 (L) 31.5 - 35.7 g/dL   RDW 86.5 88.2 - 84.5 %   Platelets 327 150 - 450 x10E3/uL   Neutrophils 66 Not Estab. %    Lymphs 24 Not Estab. %   Monocytes 8 Not Estab. %   Eos 1 Not Estab. %   Basos 1 Not Estab. %   Neutrophils Absolute 5.7 1.4 -  7.0 x10E3/uL   Lymphocytes Absolute 2.1 0.7 - 3.1 x10E3/uL   Monocytes Absolute 0.7 0.1 - 0.9 x10E3/uL   EOS (ABSOLUTE) 0.1 0.0 - 0.4 x10E3/uL   Basophils Absolute 0.1 0.0 - 0.2 x10E3/uL   Immature Granulocytes 0 Not Estab. %   Immature Grans (Abs) 0.0 0.0 - 0.1 x10E3/uL  Lipid panel     Status: Abnormal   Collection Time: 01/20/24  8:37 AM  Result Value Ref Range   Cholesterol, Total 237 (H) 100 - 199 mg/dL   Triglycerides 848 (H) 0 - 149 mg/dL   HDL 60 >60 mg/dL   VLDL Cholesterol Cal 27 5 - 40 mg/dL   LDL Chol Calc (NIH) 849 (H) 0 - 99 mg/dL   Chol/HDL Ratio 4.0 0.0 - 4.4 ratio    Comment:                                   T. Chol/HDL Ratio                                             Men  Women                               1/2 Avg.Risk  3.4    3.3                                   Avg.Risk  5.0    4.4                                2X Avg.Risk  9.6    7.1                                3X Avg.Risk 23.4   11.0   CMP14+EGFR     Status: Abnormal   Collection Time: 01/20/24  8:37 AM  Result Value Ref Range   Glucose 144 (H) 70 - 99 mg/dL   BUN 25 8 - 27 mg/dL   Creatinine, Ser 9.26 0.57 - 1.00 mg/dL   eGFR 87 >40 fO/fpw/8.26   BUN/Creatinine Ratio 34 (H) 12 - 28   Sodium 138 134 - 144 mmol/L   Potassium 5.0 3.5 - 5.2 mmol/L   Chloride 101 96 - 106 mmol/L   CO2 21 20 - 29 mmol/L   Calcium  9.8 8.7 - 10.3 mg/dL   Total Protein 7.0 6.0 - 8.5 g/dL   Albumin 4.3 3.8 - 4.8 g/dL   Globulin, Total 2.7 1.5 - 4.5 g/dL   Bilirubin Total 0.5 0.0 - 1.2 mg/dL   Alkaline Phosphatase 95 44 - 121 IU/L   AST 17 0 - 40 IU/L   ALT 11 0 - 32 IU/L  TSH     Status: None   Collection Time: 01/20/24  8:37 AM  Result Value Ref Range   TSH 1.410 0.450 - 4.500 uIU/mL  Hemoglobin A1c     Status: Abnormal   Collection Time: 01/20/24  8:37 AM  Result Value Ref Range    Hgb A1c MFr Bld 7.9 (H)  4.8 - 5.6 %    Comment:          Prediabetes: 5.7 - 6.4          Diabetes: >6.4          Glycemic control for adults with diabetes: <7.0    Est. average glucose Bld gHb Est-mCnc 180 mg/dL      Assessment & Plan:  Add Zetia .  Continue all other medications. Schedule mammogram. Reconsider getting a colonoscopy.  Problem List Items Addressed This Visit       Cardiovascular and Mediastinum   Benign essential hypertension - Primary (Chronic)   Relevant Medications   ezetimibe  (ZETIA ) 10 MG tablet     Endocrine   Type 2 diabetes mellitus with hyperglycemia, without long-term current use of insulin (HCC)   Primary hypothyroidism     Other   Mixed hyperlipidemia   Relevant Medications   ezetimibe  (ZETIA ) 10 MG tablet    Return in about 4 months (around 05/28/2024) for fasting lab work prior.   Total time spent: 25 minutes  Google, NP  01/27/2024   This document may have been prepared by Dragon Voice Recognition software and as such may include unintentional dictation errors.

## 2024-02-12 DIAGNOSIS — E119 Type 2 diabetes mellitus without complications: Secondary | ICD-10-CM | POA: Diagnosis not present

## 2024-02-12 DIAGNOSIS — H2513 Age-related nuclear cataract, bilateral: Secondary | ICD-10-CM | POA: Diagnosis not present

## 2024-02-12 DIAGNOSIS — H25013 Cortical age-related cataract, bilateral: Secondary | ICD-10-CM | POA: Diagnosis not present

## 2024-02-12 DIAGNOSIS — H40053 Ocular hypertension, bilateral: Secondary | ICD-10-CM | POA: Diagnosis not present

## 2024-02-12 DIAGNOSIS — Z01818 Encounter for other preprocedural examination: Secondary | ICD-10-CM | POA: Diagnosis not present

## 2024-02-12 DIAGNOSIS — H40059 Ocular hypertension, unspecified eye: Secondary | ICD-10-CM | POA: Diagnosis not present

## 2024-02-12 DIAGNOSIS — Z7984 Long term (current) use of oral hypoglycemic drugs: Secondary | ICD-10-CM | POA: Diagnosis not present

## 2024-02-12 DIAGNOSIS — H40033 Anatomical narrow angle, bilateral: Secondary | ICD-10-CM | POA: Diagnosis not present

## 2024-02-12 DIAGNOSIS — H2512 Age-related nuclear cataract, left eye: Secondary | ICD-10-CM | POA: Diagnosis not present

## 2024-02-13 DIAGNOSIS — H40059 Ocular hypertension, unspecified eye: Secondary | ICD-10-CM | POA: Diagnosis not present

## 2024-02-13 DIAGNOSIS — H40052 Ocular hypertension, left eye: Secondary | ICD-10-CM | POA: Diagnosis not present

## 2024-02-13 DIAGNOSIS — E1139 Type 2 diabetes mellitus with other diabetic ophthalmic complication: Secondary | ICD-10-CM | POA: Diagnosis not present

## 2024-02-13 DIAGNOSIS — H25812 Combined forms of age-related cataract, left eye: Secondary | ICD-10-CM | POA: Diagnosis not present

## 2024-02-13 DIAGNOSIS — E1136 Type 2 diabetes mellitus with diabetic cataract: Secondary | ICD-10-CM | POA: Diagnosis not present

## 2024-02-13 DIAGNOSIS — H2512 Age-related nuclear cataract, left eye: Secondary | ICD-10-CM | POA: Diagnosis not present

## 2024-02-13 DIAGNOSIS — H2511 Age-related nuclear cataract, right eye: Secondary | ICD-10-CM | POA: Diagnosis not present

## 2024-03-19 ENCOUNTER — Other Ambulatory Visit: Payer: Self-pay | Admitting: Cardiology

## 2024-04-17 ENCOUNTER — Other Ambulatory Visit: Payer: Self-pay | Admitting: Cardiology

## 2024-05-11 ENCOUNTER — Ambulatory Visit (INDEPENDENT_AMBULATORY_CARE_PROVIDER_SITE_OTHER): Admitting: Cardiology

## 2024-05-11 ENCOUNTER — Encounter: Payer: Self-pay | Admitting: Cardiology

## 2024-05-11 VITALS — BP 114/72 | HR 94 | Ht 60.0 in | Wt 166.0 lb

## 2024-05-11 DIAGNOSIS — E1165 Type 2 diabetes mellitus with hyperglycemia: Secondary | ICD-10-CM | POA: Diagnosis not present

## 2024-05-11 DIAGNOSIS — R197 Diarrhea, unspecified: Secondary | ICD-10-CM | POA: Diagnosis not present

## 2024-05-11 DIAGNOSIS — R1030 Lower abdominal pain, unspecified: Secondary | ICD-10-CM | POA: Diagnosis not present

## 2024-05-11 DIAGNOSIS — I10 Essential (primary) hypertension: Secondary | ICD-10-CM | POA: Diagnosis not present

## 2024-05-11 MED ORDER — LOPERAMIDE HCL 2 MG PO CAPS: 2.0000 mg | ORAL_CAPSULE | ORAL | 0 refills | Status: AC | PRN

## 2024-05-11 NOTE — Progress Notes (Signed)
 Established Patient Office Visit  Subjective:  Patient ID: Kathryn Poole, female    DOB: Sep 06, 1950  Age: 73 y.o. MRN: [id number]  Chief Complaint  Patient presents with   Acute Visit    Stomach Pains/ Upset Stomach x 7 days     Patient in office for an acute visit, complaining of stomach pain, diarrhea for the past 7 days. Son acts as an engineer, technical sales.  Patient reports one episode of vomiting, yesterday. Per son, patient complains of lower abdominal pain, Patient has a laxative and a stool softener in her purse, unsure if she has been taking them. Explained to son those would make her have diarrhea. Will send in imodium  for the patient.  Blood pressure well controlled today. Continue current medications.   GI Problem The primary symptoms include abdominal pain, vomiting and diarrhea. Primary symptoms do not include fever, dysuria or myalgias. The illness began 6 to 7 days ago. The onset was sudden.  The abdominal pain began more than 2 days ago. The abdominal pain is located in the suprapubic region. The abdominal pain does not radiate. The abdominal pain is relieved by nothing.  The vomiting began yesterday. Vomiting occurred once. The emesis contains stomach contents.  The illness is also significant for constipation.    No other concerns at this time.   Past Medical History:  Diagnosis Date   Diabetes (HCC)    Thyroid  disease     History reviewed. No pertinent surgical history.  Social History   Socioeconomic History   Marital status: Single    Spouse name: Not on file   Number of children: Not on file   Years of education: Not on file   Highest education level: Not on file  Occupational History   Not on file  Tobacco Use   Smoking status: Never   Smokeless tobacco: Never  Substance and Sexual Activity   Alcohol use: No    Alcohol/week: 0.0 standard drinks of alcohol   Drug use: Not on file   Sexual activity: Not on file  Other Topics Concern   Not on file  Social  History Narrative   Not on file   Social Drivers of Health   Financial Resource Strain: Not on file  Food Insecurity: Not on file  Transportation Needs: Not on file  Physical Activity: Not on file  Stress: Not on file  Social Connections: Not on file  Intimate Partner Violence: Not on file    Family History  Problem Relation Age of Onset   Breast cancer Neg Hx     No Known Allergies  Outpatient Medications Prior to Visit  Medication Sig   albuterol  (VENTOLIN  HFA) 108 (90 Base) MCG/ACT inhaler Inhale 2 puffs into the lungs every 6 (six) hours as needed for wheezing or shortness of breath.   cyclobenzaprine (FLEXERIL) 5 MG tablet Take 5 mg by mouth 3 (three) times daily.   diclofenac (VOLTAREN) 75 MG EC tablet Take 75 mg by mouth 2 (two) times daily.   ezetimibe  (ZETIA ) 10 MG tablet Take 1 tablet (10 mg total) by mouth daily.   fenofibrate  (TRICOR ) 145 MG tablet TAKE 1 TABLET(145 MG) BY MOUTH DAILY   INVOKANA  300 MG TABS tablet Take 1 tablet (300 mg total) by mouth daily.   levothyroxine  (SYNTHROID ) 150 MCG tablet TAKE 1 TABLET(150 MCG) BY MOUTH DAILY   losartan  (COZAAR ) 100 MG tablet TAKE 1 TABLET(100 MG) BY MOUTH DAILY   metFORMIN  (GLUCOPHAGE ) 1000 MG tablet Take 1 tablet (1,000 mg  total) by mouth 2 (two) times daily.   montelukast  (SINGULAIR ) 10 MG tablet Take 1 tablet (10 mg total) by mouth at bedtime.   pioglitazone  (ACTOS ) 30 MG tablet TAKE 1 TABLET(30 MG) BY MOUTH DAILY   rosuvastatin  (CRESTOR ) 40 MG tablet TAKE 1 TABLET(40 MG) BY MOUTH DAILY   SUMAtriptan  (IMITREX ) 25 MG tablet Take 1 tablet (25 mg total) by mouth every 2 (two) hours as needed for migraine. May repeat in 2 hours if headache persists or recurs.   topiramate  (TOPAMAX ) 25 MG tablet Take 1 tablet (25 mg total) by mouth at bedtime.   No facility-administered medications prior to visit.    Review of Systems  Constitutional: Negative.  Negative for fever.  HENT: Negative.    Eyes: Negative.   Respiratory:  Negative.  Negative for shortness of breath.   Cardiovascular: Negative.  Negative for chest pain.  Gastrointestinal:  Positive for abdominal pain, constipation, diarrhea and vomiting.  Genitourinary: Negative.  Negative for dysuria.  Musculoskeletal:  Negative for joint pain and myalgias.  Skin: Negative.   Neurological: Negative.  Negative for dizziness and headaches.  Endo/Heme/Allergies: Negative.   All other systems reviewed and are negative.      Objective:   BP 114/72   Pulse 94   Ht 5' (1.524 m)   Wt 166 lb (75.3 kg)   SpO2 95%   BMI 32.42 kg/m   Vitals:   05/11/24 0920  BP: 114/72  Pulse: 94  Height: 5' (1.524 m)  Weight: 166 lb (75.3 kg)  SpO2: 95%  BMI (Calculated): 32.42    Physical Exam Vitals and nursing note reviewed.  Constitutional:      Appearance: Normal appearance. She is normal weight.  HENT:     Head: Normocephalic and atraumatic.     Nose: Nose normal.     Mouth/Throat:     Mouth: Mucous membranes are moist.  Eyes:     Extraocular Movements: Extraocular movements intact.     Conjunctiva/sclera: Conjunctivae normal.     Pupils: Pupils are equal, round, and reactive to light.  Cardiovascular:     Rate and Rhythm: Normal rate and regular rhythm.     Pulses: Normal pulses.     Heart sounds: Normal heart sounds.  Pulmonary:     Effort: Pulmonary effort is normal.     Breath sounds: Normal breath sounds.  Abdominal:     General: Abdomen is flat. Bowel sounds are normal.     Palpations: Abdomen is soft.  Musculoskeletal:        General: Normal range of motion.     Cervical back: Normal range of motion.  Skin:    General: Skin is warm and dry.  Neurological:     General: No focal deficit present.     Mental Status: She is alert and oriented to person, place, and time.  Psychiatric:        Mood and Affect: Mood normal.        Behavior: Behavior normal.        Thought Content: Thought content normal.        Judgment: Judgment normal.       No results found for any visits on 05/11/24.  No results found for this or any previous visit (from the past 2160 hours).    Assessment & Plan:  Imodium  Continue current medications  Problem List Items Addressed This Visit       Cardiovascular and Mediastinum   Benign essential hypertension (Chronic)  Endocrine   Type 2 diabetes mellitus with hyperglycemia, without long-term current use of insulin (HCC)     Other   Lower abdominal pain - Primary   Diarrhea    Return if symptoms worsen or fail to improve, for as scheduled.   Total time spent: 25 minutes. This time includes review of previous notes and results and patient face to face interaction during today's visit.    Jeoffrey Pollen, NP  05/11/2024   This document may have been prepared by [facility] Voice Recognition software and as such may include unintentional dictation errors.

## 2024-05-17 ENCOUNTER — Other Ambulatory Visit: Payer: Self-pay | Admitting: Cardiology

## 2024-05-25 ENCOUNTER — Ambulatory Visit: Admitting: Cardiology

## 2024-05-31 ENCOUNTER — Other Ambulatory Visit: Payer: Self-pay | Admitting: Cardiology

## 2024-06-01 ENCOUNTER — Ambulatory Visit: Admitting: Cardiology

## 2024-06-02 ENCOUNTER — Other Ambulatory Visit

## 2024-06-02 DIAGNOSIS — E039 Hypothyroidism, unspecified: Secondary | ICD-10-CM

## 2024-06-02 DIAGNOSIS — Z1329 Encounter for screening for other suspected endocrine disorder: Secondary | ICD-10-CM

## 2024-06-02 DIAGNOSIS — I10 Essential (primary) hypertension: Secondary | ICD-10-CM

## 2024-06-02 DIAGNOSIS — E782 Mixed hyperlipidemia: Secondary | ICD-10-CM

## 2024-06-02 DIAGNOSIS — E1165 Type 2 diabetes mellitus with hyperglycemia: Secondary | ICD-10-CM

## 2024-06-03 ENCOUNTER — Ambulatory Visit: Payer: Self-pay | Admitting: Cardiology

## 2024-06-03 LAB — CMP14+EGFR
ALT: 9 IU/L (ref 0–32)
AST: 17 IU/L (ref 0–40)
Albumin: 4.5 g/dL (ref 3.8–4.8)
Alkaline Phosphatase: 122 IU/L (ref 49–135)
BUN/Creatinine Ratio: 24 (ref 12–28)
BUN: 20 mg/dL (ref 8–27)
Bilirubin Total: 0.7 mg/dL (ref 0.0–1.2)
CO2: 24 mmol/L (ref 20–29)
Calcium: 10.1 mg/dL (ref 8.7–10.3)
Chloride: 103 mmol/L (ref 96–106)
Creatinine, Ser: 0.84 mg/dL (ref 0.57–1.00)
Globulin, Total: 3 g/dL (ref 1.5–4.5)
Glucose: 158 mg/dL — ABNORMAL HIGH (ref 70–99)
Potassium: 5.7 mmol/L — ABNORMAL HIGH (ref 3.5–5.2)
Sodium: 141 mmol/L (ref 134–144)
Total Protein: 7.5 g/dL (ref 6.0–8.5)
eGFR: 74 mL/min/1.73

## 2024-06-03 LAB — CBC WITH DIFF/PLATELET
Basophils Absolute: 0.1 x10E3/uL (ref 0.0–0.2)
Basos: 1 %
EOS (ABSOLUTE): 0.1 x10E3/uL (ref 0.0–0.4)
Eos: 1 %
Hematocrit: 49.2 % — ABNORMAL HIGH (ref 34.0–46.6)
Hemoglobin: 15.7 g/dL (ref 11.1–15.9)
Immature Grans (Abs): 0 x10E3/uL (ref 0.0–0.1)
Immature Granulocytes: 0 %
Lymphocytes Absolute: 2.7 x10E3/uL (ref 0.7–3.1)
Lymphs: 39 %
MCH: 30.1 pg (ref 26.6–33.0)
MCHC: 31.9 g/dL (ref 31.5–35.7)
MCV: 94 fL (ref 79–97)
Monocytes Absolute: 0.6 x10E3/uL (ref 0.1–0.9)
Monocytes: 9 %
Neutrophils Absolute: 3.5 x10E3/uL (ref 1.4–7.0)
Neutrophils: 50 %
Platelets: 238 x10E3/uL (ref 150–450)
RBC: 5.21 x10E6/uL (ref 3.77–5.28)
RDW: 13.3 % (ref 11.7–15.4)
WBC: 6.9 x10E3/uL (ref 3.4–10.8)

## 2024-06-03 LAB — LIPID PANEL
Chol/HDL Ratio: 2.2 ratio (ref 0.0–4.4)
Cholesterol, Total: 154 mg/dL (ref 100–199)
HDL: 70 mg/dL
LDL Chol Calc (NIH): 62 mg/dL (ref 0–99)
Triglycerides: 131 mg/dL (ref 0–149)
VLDL Cholesterol Cal: 22 mg/dL (ref 5–40)

## 2024-06-03 LAB — TSH: TSH: 4.96 u[IU]/mL — ABNORMAL HIGH (ref 0.450–4.500)

## 2024-06-03 LAB — HEMOGLOBIN A1C
Est. average glucose Bld gHb Est-mCnc: 203 mg/dL
Hgb A1c MFr Bld: 8.7 % — ABNORMAL HIGH (ref 4.8–5.6)

## 2024-06-03 NOTE — Progress Notes (Signed)
 Kathryn Poole                                          MRN: [id number]   06/03/2024   The [facility] Quality Team Specialist reviewed this patient medical record for the purposes of chart review for care gap closure. The following were reviewed: abstraction for care gap closure-controlling blood pressure.    [facility] Quality Team

## 2024-06-09 ENCOUNTER — Encounter: Payer: Self-pay | Admitting: Cardiology

## 2024-06-09 ENCOUNTER — Ambulatory Visit (INDEPENDENT_AMBULATORY_CARE_PROVIDER_SITE_OTHER): Admitting: Cardiology

## 2024-06-09 VITALS — BP 124/82 | HR 67 | Ht 60.0 in | Wt 161.6 lb

## 2024-06-09 DIAGNOSIS — E782 Mixed hyperlipidemia: Secondary | ICD-10-CM | POA: Diagnosis not present

## 2024-06-09 DIAGNOSIS — I152 Hypertension secondary to endocrine disorders: Secondary | ICD-10-CM | POA: Insufficient documentation

## 2024-06-09 DIAGNOSIS — E875 Hyperkalemia: Secondary | ICD-10-CM

## 2024-06-09 DIAGNOSIS — E1165 Type 2 diabetes mellitus with hyperglycemia: Secondary | ICD-10-CM | POA: Diagnosis not present

## 2024-06-09 DIAGNOSIS — E1169 Type 2 diabetes mellitus with other specified complication: Secondary | ICD-10-CM | POA: Insufficient documentation

## 2024-06-09 DIAGNOSIS — E039 Hypothyroidism, unspecified: Secondary | ICD-10-CM | POA: Diagnosis not present

## 2024-06-09 NOTE — Progress Notes (Signed)
 "  Established Patient Office Visit  Subjective:  Patient ID: Kathryn Poole, female    DOB: [birth year]  Age: 74 y.o. MRN: [id number]  Chief Complaint  Patient presents with   Follow-up    Follow up    Patient in office for 4 month follow up, discuss recent lab results. Son acts as an engineer, technical sales. Patient doing well, no complaints today.  Discussed recent lab work. Hgb A1c elevated, patient admits to not taking her metformin , will restart. TSH elevated, recheck in 4 weeks. LDL and triglycerides improved since starting Zetia . Potassium elevated, will recheck today.  Call to schedule mammogram.  Cologuard 01/2023 positive, referral placed to GI at that time. Patient did not follow up with GI. Patient refuses colonoscopy.    No other concerns at this time.   Past Medical History:  Diagnosis Date   Diabetes (HCC)    Thyroid  disease     History reviewed. No pertinent surgical history.  Social History   Socioeconomic History   Marital status: Single    Spouse name: Not on file   Number of children: Not on file   Years of education: Not on file   Highest education level: Not on file  Occupational History   Not on file  Tobacco Use   Smoking status: Never   Smokeless tobacco: Never  Substance and Sexual Activity   Alcohol use: No    Alcohol/week: 0.0 standard drinks of alcohol   Drug use: Not on file   Sexual activity: Not on file  Other Topics Concern   Not on file  Social History Narrative   Not on file   Social Drivers of Health   Tobacco Use: Low Risk (06/09/2024)   Patient History    Smoking Tobacco Use: Never    Smokeless Tobacco Use: Never    Passive Exposure: Not on file  Financial Resource Strain: Not on file  Food Insecurity: Not on file  Transportation Needs: Not on file  Physical Activity: Not on file  Stress: Not on file  Social Connections: Not on file  Intimate Partner Violence: Not on file  Depression (EYV7-0): Not on file  Alcohol Screen: Not on file   Housing: Not on file  Utilities: Not on file  Health Literacy: Not on file    Family History  Problem Relation Age of Onset   Breast cancer Neg Hx     Allergies[1]  Show/hide medication list[2]  Review of Systems  Constitutional: Negative.   HENT: Negative.    Eyes: Negative.   Respiratory: Negative.  Negative for shortness of breath.   Cardiovascular: Negative.  Negative for chest pain.  Gastrointestinal: Negative.  Negative for abdominal pain, constipation and diarrhea.  Genitourinary: Negative.   Musculoskeletal:  Negative for joint pain and myalgias.  Skin: Negative.   Neurological: Negative.  Negative for dizziness and headaches.  Endo/Heme/Allergies: Negative.   All other systems reviewed and are negative.      Objective:   BP 124/82   Pulse 67   Ht 5' (1.524 m)   Wt 161 lb 9.6 oz (73.3 kg)   SpO2 94%   BMI 31.56 kg/m   Vitals:   06/09/24 0900  BP: 124/82  Pulse: 67  Height: 5' (1.524 m)  Weight: 161 lb 9.6 oz (73.3 kg)  SpO2: 94%  BMI (Calculated): 31.56    Physical Exam Vitals and nursing note reviewed.  Constitutional:      Appearance: Normal appearance. She is normal weight.  HENT:  Head: Normocephalic and atraumatic.     Nose: Nose normal.     Mouth/Throat:     Mouth: Mucous membranes are moist.  Eyes:     Extraocular Movements: Extraocular movements intact.     Conjunctiva/sclera: Conjunctivae normal.     Pupils: Pupils are equal, round, and reactive to light.  Cardiovascular:     Rate and Rhythm: Normal rate and regular rhythm.     Pulses: Normal pulses.     Heart sounds: Normal heart sounds.  Pulmonary:     Effort: Pulmonary effort is normal.     Breath sounds: Normal breath sounds.  Abdominal:     General: Abdomen is flat. Bowel sounds are normal.     Palpations: Abdomen is soft.  Musculoskeletal:        General: Normal range of motion.     Cervical back: Normal range of motion.  Skin:    General: Skin is warm and dry.   Neurological:     General: No focal deficit present.     Mental Status: She is alert and oriented to person, place, and time.  Psychiatric:        Mood and Affect: Mood normal.        Behavior: Behavior normal.        Thought Content: Thought content normal.        Judgment: Judgment normal.      No results found for any visits on 06/09/24.  Recent Results (from the past 2160 hours)  Hemoglobin A1c     Status: Abnormal   Collection Time: 06/02/24  8:17 AM  Result Value Ref Range   Hgb A1c MFr Bld 8.7 (H) 4.8 - 5.6 %    Comment:          Prediabetes: 5.7 - 6.4          Diabetes: >6.4          Glycemic control for adults with diabetes: <7.0    Est. average glucose Bld gHb Est-mCnc 203 mg/dL  TSH     Status: Abnormal   Collection Time: 06/02/24  8:17 AM  Result Value Ref Range   TSH 4.960 (H) 0.450 - 4.500 uIU/mL  CMP14+EGFR     Status: Abnormal   Collection Time: 06/02/24  8:17 AM  Result Value Ref Range   Glucose 158 (H) 70 - 99 mg/dL   BUN 20 8 - 27 mg/dL   Creatinine, Ser 9.15 0.57 - 1.00 mg/dL   eGFR 74 >40 fO/fpw/8.26   BUN/Creatinine Ratio 24 12 - 28   Sodium 141 134 - 144 mmol/L   Potassium 5.7 (H) 3.5 - 5.2 mmol/L   Chloride 103 96 - 106 mmol/L   CO2 24 20 - 29 mmol/L   Calcium  10.1 8.7 - 10.3 mg/dL   Total Protein 7.5 6.0 - 8.5 g/dL   Albumin 4.5 3.8 - 4.8 g/dL   Globulin, Total 3.0 1.5 - 4.5 g/dL   Bilirubin Total 0.7 0.0 - 1.2 mg/dL   Alkaline Phosphatase 122 49 - 135 IU/L   AST 17 0 - 40 IU/L   ALT 9 0 - 32 IU/L  Lipid panel     Status: None   Collection Time: 06/02/24  8:17 AM  Result Value Ref Range   Cholesterol, Total 154 100 - 199 mg/dL   Triglycerides 868 0 - 149 mg/dL   HDL 70 >60 mg/dL   VLDL Cholesterol Cal 22 5 - 40 mg/dL   LDL Chol Calc (NIH)  62 0 - 99 mg/dL   Chol/HDL Ratio 2.2 0.0 - 4.4 ratio    Comment:                                   T. Chol/HDL Ratio                                             Men  Women                                1/2 Avg.Risk  3.4    3.3                                   Avg.Risk  5.0    4.4                                2X Avg.Risk  9.6    7.1                                3X Avg.Risk 23.4   11.0   CBC With Diff/Platelet     Status: Abnormal   Collection Time: 06/02/24  8:17 AM  Result Value Ref Range   WBC 6.9 3.4 - 10.8 x10E3/uL   RBC 5.21 3.77 - 5.28 x10E6/uL   Hemoglobin 15.7 11.1 - 15.9 g/dL   Hematocrit 50.7 (H) 65.9 - 46.6 %   MCV 94 79 - 97 fL   MCH 30.1 26.6 - 33.0 pg   MCHC 31.9 31.5 - 35.7 g/dL   RDW 86.6 88.2 - 84.5 %   Platelets 238 150 - 450 x10E3/uL   Neutrophils 50 Not Estab. %   Lymphs 39 Not Estab. %   Monocytes 9 Not Estab. %   Eos 1 Not Estab. %   Basos 1 Not Estab. %   Neutrophils Absolute 3.5 1.4 - 7.0 x10E3/uL   Lymphocytes Absolute 2.7 0.7 - 3.1 x10E3/uL   Monocytes Absolute 0.6 0.1 - 0.9 x10E3/uL   EOS (ABSOLUTE) 0.1 0.0 - 0.4 x10E3/uL   Basophils Absolute 0.1 0.0 - 0.2 x10E3/uL   Immature Granulocytes 0 Not Estab. 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   Total time spent: 25 minutes. This time  includes review of previous notes and results and patient face to face interaction during today's visit.    Jeoffrey Pollen, NP  06/09/2024   This  document may have been prepared by Dragon Voice Recognition software and as such may include unintentional dictation errors.     [1] No Known Allergies [2]  Outpatient Medications Prior to Visit  Medication Sig   albuterol  (VENTOLIN  HFA) 108 (90 Base) MCG/ACT inhaler Inhale 2 puffs into the lungs every 6 (six) hours as needed for wheezing or shortness of breath.   cyclobenzaprine (FLEXERIL) 5 MG tablet Take 5 mg by mouth 3 (three) times daily. (Patient taking differently: Take 5 mg by mouth as needed.)   ezetimibe  (ZETIA ) 10 MG tablet Take 1 tablet (10 mg total) by mouth daily.   fenofibrate  (TRICOR ) 145 MG tablet TAKE 1 TABLET(145 MG) BY MOUTH DAILY   INVOKANA  300 MG TABS tablet TAKE 1 TABLET(300 MG) BY MOUTH DAILY   levothyroxine  (SYNTHROID ) 150 MCG tablet TAKE 1 TABLET(150 MCG) BY MOUTH DAILY   losartan  (COZAAR ) 100 MG tablet TAKE 1 TABLET(100 MG) BY MOUTH DAILY   montelukast  (SINGULAIR ) 10 MG tablet Take 1 tablet (10 mg total) by mouth at bedtime.   pioglitazone  (ACTOS ) 30 MG tablet TAKE 1 TABLET(30 MG) BY MOUTH DAILY   rosuvastatin  (CRESTOR ) 40 MG tablet TAKE 1 TABLET(40 MG) BY MOUTH DAILY   topiramate  (TOPAMAX ) 25 MG tablet TAKE 1 TABLET(25 MG) BY MOUTH AT BEDTIME   diclofenac (VOLTAREN) 75 MG EC tablet Take 75 mg by mouth 2 (two) times daily. (Patient not taking: Reported on 06/09/2024)   loperamide  (IMODIUM ) 2 MG capsule Take 1 capsule (2 mg total) by mouth as needed for diarrhea or loose stools. (Patient not taking: Reported on 06/09/2024)   metFORMIN  (GLUCOPHAGE ) 1000 MG tablet Take 1 tablet (1,000 mg total) by mouth 2 (two) times daily. (Patient not taking: Reported on 06/09/2024)   SUMAtriptan  (IMITREX ) 25 MG tablet Take 1 tablet (25 mg total) by mouth every 2 (two) hours as needed for migraine. May repeat in 2 hours if headache persists or recurs. (Patient not taking: Reported on 06/09/2024)   No facility-administered medications prior to visit.   "

## 2024-06-10 LAB — BASIC METABOLIC PANEL WITH GFR
BUN/Creatinine Ratio: 24 (ref 12–28)
BUN: 20 mg/dL (ref 8–27)
CO2: 17 mmol/L — ABNORMAL LOW (ref 20–29)
Calcium: 9.9 mg/dL (ref 8.7–10.3)
Chloride: 103 mmol/L (ref 96–106)
Creatinine, Ser: 0.84 mg/dL (ref 0.57–1.00)
Glucose: 259 mg/dL — ABNORMAL HIGH (ref 70–99)
Potassium: 4.5 mmol/L (ref 3.5–5.2)
Sodium: 140 mmol/L (ref 134–144)
eGFR: 74 mL/min/1.73

## 2024-06-11 ENCOUNTER — Ambulatory Visit: Payer: Self-pay | Admitting: Cardiology

## 2024-06-11 NOTE — Progress Notes (Signed)
 Pt informed

## 2024-07-10 ENCOUNTER — Other Ambulatory Visit

## 2024-07-10 DIAGNOSIS — E039 Hypothyroidism, unspecified: Secondary | ICD-10-CM

## 2024-10-13 ENCOUNTER — Ambulatory Visit: Admitting: Internal Medicine
